# Patient Record
Sex: Female | Born: 1955
Health system: Southern US, Community
[De-identification: ages and names within clinical notes are randomized; demographics above are authoritative.]

## PROBLEM LIST (undated history)

## (undated) DIAGNOSIS — C679 Malignant neoplasm of bladder, unspecified: Secondary | ICD-10-CM

## (undated) DIAGNOSIS — M1990 Unspecified osteoarthritis, unspecified site: Secondary | ICD-10-CM

## (undated) DIAGNOSIS — E785 Hyperlipidemia, unspecified: Secondary | ICD-10-CM

## (undated) DIAGNOSIS — Z860100 Personal history of colon polyps, unspecified: Secondary | ICD-10-CM

## (undated) DIAGNOSIS — M797 Fibromyalgia: Secondary | ICD-10-CM

## (undated) DIAGNOSIS — K5790 Diverticulosis of intestine, part unspecified, without perforation or abscess without bleeding: Secondary | ICD-10-CM

## (undated) DIAGNOSIS — B029 Zoster without complications: Secondary | ICD-10-CM

## (undated) DIAGNOSIS — Z8619 Personal history of other infectious and parasitic diseases: Secondary | ICD-10-CM

## (undated) HISTORY — DX: Malignant neoplasm of bladder, unspecified: C67.9

## (undated) HISTORY — DX: Diverticulosis of intestine, part unspecified, without perforation or abscess without bleeding: K57.90

## (undated) HISTORY — DX: Personal history of colon polyps, unspecified: Z86.0100

## (undated) HISTORY — DX: Unspecified osteoarthritis, unspecified site: M19.90

## (undated) HISTORY — DX: Personal history of other infectious and parasitic diseases: Z86.19

## (undated) HISTORY — DX: Hyperlipidemia, unspecified: E78.5

## (undated) HISTORY — DX: Personal history of colonic polyps: Z86.010

## (undated) HISTORY — DX: Fibromyalgia: M79.7

## (undated) HISTORY — PX: TRANSURETHRAL RESECTION OF BLADDER TUMOR: SHX2575

---

## 2000-08-06 ENCOUNTER — Other Ambulatory Visit: Admission: RE | Admit: 2000-08-06 | Discharge: 2000-08-06 | Payer: Self-pay | Admitting: *Deleted

## 2006-04-07 DIAGNOSIS — Z8551 Personal history of malignant neoplasm of bladder: Secondary | ICD-10-CM | POA: Insufficient documentation

## 2006-04-07 DIAGNOSIS — C679 Malignant neoplasm of bladder, unspecified: Secondary | ICD-10-CM

## 2006-04-07 HISTORY — DX: Malignant neoplasm of bladder, unspecified: C67.9

## 2007-01-17 ENCOUNTER — Emergency Department: Payer: Self-pay | Admitting: Unknown Physician Specialty

## 2007-04-08 HISTORY — PX: OTHER SURGICAL HISTORY: SHX169

## 2009-06-15 DIAGNOSIS — Z8551 Personal history of malignant neoplasm of bladder: Secondary | ICD-10-CM | POA: Insufficient documentation

## 2009-06-15 DIAGNOSIS — M25559 Pain in unspecified hip: Secondary | ICD-10-CM | POA: Insufficient documentation

## 2009-06-21 ENCOUNTER — Ambulatory Visit: Payer: Self-pay | Admitting: Family Medicine

## 2009-06-28 DIAGNOSIS — E785 Hyperlipidemia, unspecified: Secondary | ICD-10-CM

## 2009-06-28 DIAGNOSIS — E782 Mixed hyperlipidemia: Secondary | ICD-10-CM | POA: Insufficient documentation

## 2009-06-28 DIAGNOSIS — E559 Vitamin D deficiency, unspecified: Secondary | ICD-10-CM | POA: Insufficient documentation

## 2009-06-28 HISTORY — DX: Hyperlipidemia, unspecified: E78.5

## 2009-07-19 ENCOUNTER — Ambulatory Visit: Payer: Self-pay | Admitting: Family Medicine

## 2009-07-19 DIAGNOSIS — M76899 Other specified enthesopathies of unspecified lower limb, excluding foot: Secondary | ICD-10-CM | POA: Insufficient documentation

## 2009-08-15 ENCOUNTER — Ambulatory Visit: Payer: Self-pay | Admitting: Unknown Physician Specialty

## 2010-04-07 ENCOUNTER — Emergency Department: Payer: Self-pay | Admitting: Internal Medicine

## 2010-07-15 ENCOUNTER — Emergency Department: Payer: Self-pay | Admitting: Emergency Medicine

## 2011-04-08 HISTORY — PX: COLONOSCOPY: SHX174

## 2011-04-08 LAB — HM PAP SMEAR

## 2011-04-08 LAB — HM MAMMOGRAPHY

## 2011-04-08 LAB — HM COLONOSCOPY

## 2012-03-08 ENCOUNTER — Ambulatory Visit: Payer: Self-pay | Admitting: Family Medicine

## 2012-04-07 DIAGNOSIS — M797 Fibromyalgia: Secondary | ICD-10-CM

## 2012-04-07 HISTORY — DX: Fibromyalgia: M79.7

## 2012-06-17 ENCOUNTER — Other Ambulatory Visit: Payer: Self-pay | Admitting: Rheumatology

## 2012-06-17 DIAGNOSIS — Z780 Asymptomatic menopausal state: Secondary | ICD-10-CM

## 2012-06-28 ENCOUNTER — Ambulatory Visit
Admission: RE | Admit: 2012-06-28 | Discharge: 2012-06-28 | Disposition: A | Discharge status: Home / Self Care | Payer: BC Managed Care – PPO | Source: Ambulatory Visit | Attending: Rheumatology | Admitting: Rheumatology

## 2012-06-28 DIAGNOSIS — Z780 Asymptomatic menopausal state: Secondary | ICD-10-CM

## 2012-08-10 ENCOUNTER — Ambulatory Visit: Payer: Self-pay | Admitting: Family Medicine

## 2013-04-14 ENCOUNTER — Ambulatory Visit: Payer: Self-pay | Admitting: Neurology

## 2013-11-23 ENCOUNTER — Ambulatory Visit: Payer: BC Managed Care – PPO | Admitting: Neurology

## 2013-11-24 ENCOUNTER — Ambulatory Visit: Payer: Self-pay | Admitting: Family Medicine

## 2013-11-30 ENCOUNTER — Ambulatory Visit: Payer: Self-pay | Admitting: Family Medicine

## 2013-12-02 ENCOUNTER — Inpatient Hospital Stay: Payer: Self-pay | Admitting: Internal Medicine

## 2013-12-02 LAB — URINALYSIS, COMPLETE
Bacteria: NONE SEEN
Bilirubin,UR: NEGATIVE
Glucose,UR: NEGATIVE mg/dL (ref 0–75)
Leukocyte Esterase: NEGATIVE
Nitrite: NEGATIVE
Ph: 5 (ref 4.5–8.0)
Protein: 30
RBC,UR: 2 /HPF (ref 0–5)
Specific Gravity: 1.03 (ref 1.003–1.030)
Squamous Epithelial: 4
WBC UR: 2 /HPF (ref 0–5)

## 2013-12-02 LAB — COMPREHENSIVE METABOLIC PANEL
Albumin: 3.7 g/dL (ref 3.4–5.0)
Alkaline Phosphatase: 101 U/L
Anion Gap: 13 (ref 7–16)
BUN: 20 mg/dL — ABNORMAL HIGH (ref 7–18)
Bilirubin,Total: 1.1 mg/dL — ABNORMAL HIGH (ref 0.2–1.0)
Calcium, Total: 9 mg/dL (ref 8.5–10.1)
Chloride: 104 mmol/L (ref 98–107)
Co2: 20 mmol/L — ABNORMAL LOW (ref 21–32)
Creatinine: 0.88 mg/dL (ref 0.60–1.30)
EGFR (African American): >60
EGFR (Non-African Amer.): >60
Glucose: 155 mg/dL — ABNORMAL HIGH (ref 65–99)
Osmolality: 280 (ref 275–301)
Potassium: 3.9 mmol/L (ref 3.5–5.1)
SGOT(AST): 18 U/L (ref 15–37)
SGPT (ALT): 30 U/L
Sodium: 137 mmol/L (ref 136–145)
Total Protein: 7.4 g/dL (ref 6.4–8.2)

## 2013-12-02 LAB — CBC WITH DIFFERENTIAL/PLATELET
Basophil #: 0.2 10*3/uL — ABNORMAL HIGH (ref 0.0–0.1)
Basophil %: 1 %
Eosinophil #: 0.7 10*3/uL (ref 0.0–0.7)
Eosinophil %: 4.3 %
HCT: 54.7 % — ABNORMAL HIGH (ref 35.0–47.0)
HGB: 18.2 g/dL — ABNORMAL HIGH (ref 12.0–16.0)
Lymphocyte #: 3.3 10*3/uL (ref 1.0–3.6)
Lymphocyte %: 20.4 %
MCH: 28.9 pg (ref 26.0–34.0)
MCHC: 33.3 g/dL (ref 32.0–36.0)
MCV: 87 fL (ref 80–100)
Monocyte #: 1.2 x10 3/mm — ABNORMAL HIGH (ref 0.2–0.9)
Monocyte %: 7.4 %
Neutrophil #: 10.8 10*3/uL — ABNORMAL HIGH (ref 1.4–6.5)
Neutrophil %: 66.9 %
Platelet: 371 10*3/uL (ref 150–440)
RBC: 6.3 10*6/uL — ABNORMAL HIGH (ref 3.80–5.20)
RDW: 14 % (ref 11.5–14.5)
WBC: 16.1 10*3/uL — ABNORMAL HIGH (ref 3.6–11.0)

## 2013-12-02 LAB — LIPASE, BLOOD: Lipase: 194 U/L (ref 73–393)

## 2013-12-03 LAB — BASIC METABOLIC PANEL
Anion Gap: 1 — ABNORMAL LOW (ref 7–16)
BUN: 13 mg/dL (ref 7–18)
Calcium, Total: 7.4 mg/dL — ABNORMAL LOW (ref 8.5–10.1)
Chloride: 111 mmol/L — ABNORMAL HIGH (ref 98–107)
Co2: 27 mmol/L (ref 21–32)
Creatinine: 0.75 mg/dL (ref 0.60–1.30)
EGFR (African American): >60
EGFR (Non-African Amer.): >60
Glucose: 81 mg/dL (ref 65–99)
Osmolality: 277 (ref 275–301)
Potassium: 3.4 mmol/L — ABNORMAL LOW (ref 3.5–5.1)
Sodium: 139 mmol/L (ref 136–145)

## 2013-12-03 LAB — CBC WITH DIFFERENTIAL/PLATELET
Basophil #: 0.1 10*3/uL (ref 0.0–0.1)
Basophil %: 0.7 %
Eosinophil #: 0.5 10*3/uL (ref 0.0–0.7)
Eosinophil %: 4.7 %
HCT: 43.3 % (ref 35.0–47.0)
HGB: 14.3 g/dL (ref 12.0–16.0)
Lymphocyte #: 3.2 10*3/uL (ref 1.0–3.6)
Lymphocyte %: 31.4 %
MCH: 29.4 pg (ref 26.0–34.0)
MCHC: 33 g/dL (ref 32.0–36.0)
MCV: 89 fL (ref 80–100)
Monocyte #: 0.8 x10 3/mm (ref 0.2–0.9)
Monocyte %: 8 %
Neutrophil #: 5.6 10*3/uL (ref 1.4–6.5)
Neutrophil %: 55.2 %
Platelet: 216 10*3/uL (ref 150–440)
RBC: 4.86 10*6/uL (ref 3.80–5.20)
RDW: 13.3 % (ref 11.5–14.5)
WBC: 10.2 10*3/uL (ref 3.6–11.0)

## 2013-12-07 LAB — CULTURE, BLOOD (SINGLE)

## 2014-04-07 HISTORY — PX: COLONOSCOPY: SHX174

## 2014-07-19 ENCOUNTER — Emergency Department
Admit: 2014-07-19 | Disposition: A | Discharge status: Home / Self Care | Payer: Self-pay | Admitting: Emergency Medicine

## 2014-07-19 DIAGNOSIS — K5790 Diverticulosis of intestine, part unspecified, without perforation or abscess without bleeding: Secondary | ICD-10-CM

## 2014-07-19 DIAGNOSIS — Z8719 Personal history of other diseases of the digestive system: Secondary | ICD-10-CM

## 2014-07-19 HISTORY — DX: Personal history of other diseases of the digestive system: Z87.19

## 2014-07-19 HISTORY — DX: Diverticulosis of intestine, part unspecified, without perforation or abscess without bleeding: K57.90

## 2014-07-19 LAB — URINALYSIS, COMPLETE
Bacteria: NONE SEEN
Bilirubin,UR: NEGATIVE
Glucose,UR: NEGATIVE mg/dL (ref 0–75)
Ketone: NEGATIVE
Leukocyte Esterase: NEGATIVE
Nitrite: NEGATIVE
Ph: 5 (ref 4.5–8.0)
Protein: 30
Specific Gravity: 1.032 (ref 1.003–1.030)

## 2014-07-19 LAB — TROPONIN I: Troponin-I: <0.03 ng/mL

## 2014-07-19 LAB — COMPREHENSIVE METABOLIC PANEL
Albumin: 4.6 g/dL
Alkaline Phosphatase: 113 U/L
Anion Gap: 8 (ref 7–16)
BUN: 15 mg/dL
Bilirubin,Total: 0.8 mg/dL
Calcium, Total: 9 mg/dL
Chloride: 105 mmol/L
Co2: 25 mmol/L
Creatinine: 0.61 mg/dL
EGFR (African American): >60
EGFR (Non-African Amer.): >60
Glucose: 130 mg/dL — ABNORMAL HIGH
Potassium: 3.6 mmol/L
SGOT(AST): 25 U/L
SGPT (ALT): 24 U/L
Sodium: 138 mmol/L
Total Protein: 8 g/dL

## 2014-07-19 LAB — CBC WITH DIFFERENTIAL/PLATELET
Basophil #: 0.1 10*3/uL (ref 0.0–0.1)
Basophil %: 0.9 %
Eosinophil #: 0.2 10*3/uL (ref 0.0–0.7)
Eosinophil %: 2.3 %
HCT: 40.5 % (ref 35.0–47.0)
HGB: 13.8 g/dL (ref 12.0–16.0)
Lymphocyte #: 2.3 10*3/uL (ref 1.0–3.6)
Lymphocyte %: 21 %
MCH: 29.2 pg (ref 26.0–34.0)
MCHC: 34.1 g/dL (ref 32.0–36.0)
MCV: 86 fL (ref 80–100)
Monocyte #: 0.6 x10 3/mm (ref 0.2–0.9)
Monocyte %: 5.8 %
Neutrophil #: 7.6 10*3/uL — ABNORMAL HIGH (ref 1.4–6.5)
Neutrophil %: 70 %
Platelet: 228 10*3/uL (ref 150–440)
RBC: 4.73 10*6/uL (ref 3.80–5.20)
RDW: 13.8 % (ref 11.5–14.5)
WBC: 10.8 10*3/uL (ref 3.6–11.0)

## 2014-07-19 LAB — LIPASE, BLOOD: Lipase: 55 U/L — ABNORMAL HIGH

## 2014-07-24 LAB — BASIC METABOLIC PANEL
BUN: 11 mg/dL (ref 4–21)
Creatinine: 0.6 mg/dL (ref <=1.1)
Glucose: 190 mg/dL
Potassium: 4.2 mmol/L (ref 3.4–5.3)
Sodium: 143 mmol/L (ref 137–147)

## 2014-07-24 LAB — HEPATIC FUNCTION PANEL
ALT: 31 U/L (ref 7–35)
AST: 27 U/L (ref 13–35)

## 2014-07-24 LAB — CBC AND DIFFERENTIAL
HCT: 44 % (ref 36–46)
Hemoglobin: 14.5 g/dL (ref 12.0–16.0)
Platelets: 277 10*3/uL (ref 150–399)
WBC: 7.5 10^3/mL

## 2014-07-24 LAB — TSH: TSH: 1.4 u[IU]/mL (ref <=5.90)

## 2014-07-29 NOTE — Discharge Summary (Signed)
PATIENT NAME:  Rebekah Myers, Rebekah Myers MR#:  [id number] DATE OF BIRTH:  05/10/1955  DATE OF ADMISSION:  12/02/2013 DATE OF DISCHARGE:  12/04/2013  DISCHARGE DIAGNOSES: 1.  Right lower lobe pneumonia.  2.  Acute bronchitis.  3.  Nausea, vomiting, likely from gastritis.  4.  Dehydration.   DISCHARGE MEDICATIONS: Are:  1.  Gabapentin 100 mg 2 capsules oral once a day.  2.  Advair Diskus  one puff inhaled 2 times a day.  3.  Promethazine 25 mg every 8 hours as needed for nausea or vomiting.  4.  Gabapentin 100 mg oral once a day.  5.  Dexilant 60 mg oral 2 times a day.  6.  Erythromycin 500 mg once a day.  7.  Cefpodoxime 200 mg oral 2 times a day.  8.  Ventolin HFA 2 puffs inhaled every 4 hours as needed.  9.  Tussionex 5 mL oral 2 times a day as needed for cough.   DISCHARGE INSTRUCTIONS:  Regular diet, activity as tolerated, follow-up with primary care physician in 1 week.   IMAGING STUDIES DONE:  Include:  1.  A chest x-ray, which is normal.  2.  CT scan of the abdomen and pelvis which showed a fatty liver, nothing acute, but did show right lower lobe infiltrate, without any effusion.   ADMITTING HISTORY AND PHYSICAL:  Please see detailed H and P dictated previously. In brief, a 59 year old female patient with past history of smoking brought into the hospital complaining of nausea, vomiting, chronic cough; the patient has gone through 2 rounds of prednisone and antibiotics as outpatient and CT scan showing right lower lobe infiltrate, intractable nausea, or vomiting, the patient was admitted to hospitalist service.   HOSPITAL COURSE: 1.  Right lower lobe pneumonia with acute bronchitis. The patient was started on IV antibiotics with ceftriaxone, azithromycin along with IV steroids, scheduled nebulizers, oxygen support with which she improved well, and during the 48 hours of stay patient feels significantly better. Still has some cough but her wheezing has resolved. No shortness of breath. Feels  stable to go home and will be discharged back home on cefpodoxime, and azithromycin along with prednisone taper which she already has at home. The patient has been given an inhaler with albuterol to use 2 puffs every 4 hours as needed.  2.  Nausea or vomiting. This is likely secondary from gastritis due to prolonged use of prednisone. This has resolved with treatment with IV Protonix. She will be switched to Dexilant  2 times a day as long as she is on steroids. She has tolerated food. No vomiting.   Prior to discharge the patient's lungs sound clear, good air entry; heart sounds are S1, S2; abdomen is soft; no edema.   DISCHARGE TIME SPENT ON DAY OF DISCHARGE:  Was 40 minutes.    ____________________________ Leia Alf Graceland Wachter, MD srs:nt D: 12/05/2013 12:52:00 ET T: 12/05/2013 16:12:55 ET JOB#: [id number]  cc: Rebekah Heimlich R. Dewanna Hurston, MD, <Dictator> Rebekah Carp MD ELECTRONICALLY SIGNED 12/20/2013 20:48

## 2014-07-29 NOTE — H&P (Signed)
PATIENT NAME:  Rebekah Myers, Rebekah Myers MR#:  [id number] DATE OF BIRTH:  March 05, 1956  DATE OF ADMISSION:  12/02/2013  PRIMARY CARE PHYSICIAN:  Jerrol Banana, MD, at [facility].   CHIEF COMPLAINT:  Dry hacky cough for about 3 weeks and significant nausea, vomiting, and epigastric pain for the last couple days.   HISTORY OF PRESENT ILLNESS:  Rebekah Myers is a 59 year old very pleasant, Caucasian female with history of tobacco abuse in the remote past, has had 2 rounds of antibiotics with erythromycin and Levaquin for her nonproductive cough. She also received increasing dose of prednisone which has made her stomach upset and she has been vomiting for the last several days to the point she cannot keep anything in the stomach, and has had dry heaves. Comes to the Emergency Room feeling very weak, is clinically dehydrated, and had vomited about 45 minutes ago prior to my arrival.   Her CT scan of the abdomen, pelvis, and a CT scan of the chest shows lingering right lower lobe pneumonia. She also feels very weak and exhausted. Patient is going to be admitted for right lower lobe pneumonia and nausea, vomiting, diarrhea with dehydration.   PAST MEDICAL HISTORY:  1.  Bladder cancer with ongoing treatment, she has her oncologist at [facility]  2.  Remote history of smoking with possible underlying COPD although patient does not have that diagnosis.  3.  Recent treatment for nonproductive cough/pneumonia.   ALLERGIES:  TO:  1.  PENICILLIN CAUSES SWELLING, PATIENT HAS HAD IT MANY YEARS AGO.  2.  SULFA.   HOME MEDICATIONS:  Are:  1.  Promethazines 25 mg 1 suppository every 8 hours as needed.  2.  Gabapentin 100 mg 1 capsule in the morning, 100 mg 2 capsules at bedtime.  3.  Dexilant 60 mg p.o. daily.  4.  Advair 500/50 one puff b.i.d.    SOCIAL HISTORY:  She is an ex-smoker, nonalcoholic, denies any drug use.   REVIEW OF SYSTEMS:  CONSTITUTIONAL:  Positive for fatigue, weakness, and chills.   EYES:  No blurred or double vision, or  redness.  ENT:  No tinnitus, ear pain, hearing loss, or postnasal drip.  RESPIRATORY:  Positive for cough and dyspnea, no hemoptysis.  CARDIOVASCULAR:  No chest pain, orthopnea, edema, palpitations.  GASTROINTESTINAL:  Positive for nausea, vomiting, epigastric abdominal pain and GERD, no rectal bleed, or hemorrhoids.  GENITOURINARY:  No dysuria, hematuria, or frequency.  ENDOCRINE:  No polyuria, nocturia, or thyroid problems.  HEMATOLOGY:  No anemia or easy bruising.  SKIN:  No acne, rash, or lesion.  MUSCULOSKELETAL:  Positive for arthritis. No cramps or swelling, or gout.  NEUROLOGIC:  No CVA, TIA, dysarthria.  PSYCHIATRIC:  No anxiety, depression, or bipolar disorder, all other systems reviewed are negative.   PHYSICAL EXAMINATION:  GENERAL:  Patient is awake, alert, oriented x 3, not in acute distress.  VITAL SIGNS:  Afebrile, pulse is 96, blood pressure is 120/79, sats are 94% on room air.  HEENT:  Atraumatic, normocephalic, pupils PERLA, EOMI intact, oral mucosa is dry.  NECK:  Supple. No JVD. No carotid bruit.  RESPIRATORY:  No distress, labored breathing, there are some crackles present right bases, no rhonchi, occasional wheezing heard, no use of accessory muscles.  CARDIOVASCULAR:  Both the heart sounds are normal, tachycardia present, no murmur heard, PMI not lateralized, chest nontender.  EXTREMITIES:  Good pedal pulses. Good femoral pulses. No lower extremity edema.  ABDOMEN:  Soft, benign, nontender. No  organomegaly. Positive bowel sounds.  NEUROLOGIC:  Grossly intact cranial nerves II through XII, no motor, or sensory deficits.  PSYCHIATRIC:  The patient is awake, alert, oriented x 3.   DIAGNOSTIC DATA:   1.  CT of the abdomen, pelvis with contrast shows right lower lobe infiltrate consistent with pneumonia, diffuse fatty infiltration of the liver, small hilar hernia.  2.  UA negative for urinary tract infection, white count is 16.1,  H and H is 18.2 and 54.7; platelet count is 371,000, creatinine is 0.88, sodium is 137, potassium 3.9, LFTs within normal limits, lipase is 194.   ASSESSMENT:  A 59 year old Rebekah Myers with history of bladder cancer and remote history of tobacco abuse comes in with:  1.  Intractable nausea, vomiting with epigastric pain after taking 2 rounds of high-dose steroids for her cough and respiratory infection. Patient appears to be dehydrated with hemoglobin and hematocrit that is hemoconcentrated. We will admit her to medical floor, give her intravenous fluids aggressively; monitor Ins and Outs, and CBC, and metB.  The patient will be started on some ice chips and clear liquids and we will give her intravenous Zofran as needed for nausea and vomiting.   2.  Persistent right liver lobe pneumonia. The patient had 2 rounds of antibiotics with Levaquin and erythromycin. She continues to have some cough; elevated white count of 16.8. We will start her  on intravenous Rocephin and Zithromax, the patient has a history of ALLERGIES TO PENICILLIN, did discuss with her about cross-reactivity with ceftriaxone group of drugs. Patient has voiced understanding and is okay with it. No indication for IV steroids at this time. Use her inhalers and DuoNebs as needed for shortness of breath.  3.  History of bladder cancer. Patient is undergoing treatment at [facility] with her oncologist.  4.  Deep vein thrombosis prophylaxis. We will give her subcutaneous heparin t.i.d.  5.  Above was discussed with patient and her grandson who was present in the Emergency Room.   TIME SPENT:  Was 50 minutes.    ____________________________ Hart Rochester Posey Pronto, MD sap:nt D: 12/02/2013 20:21:02 ET T: 12/02/2013 22:33:42 ET JOB#: [id number]  cc: Jebediah Macrae A. Posey Pronto, MD, <Dictator> Richard L. Rosanna Randy, MD Ilda Basset MD ELECTRONICALLY SIGNED 12/13/2013 14:52

## 2014-09-27 ENCOUNTER — Encounter: Payer: Self-pay | Admitting: Obstetrics and Gynecology

## 2014-10-23 DIAGNOSIS — N950 Postmenopausal bleeding: Secondary | ICD-10-CM | POA: Insufficient documentation

## 2014-10-23 DIAGNOSIS — K5790 Diverticulosis of intestine, part unspecified, without perforation or abscess without bleeding: Secondary | ICD-10-CM | POA: Insufficient documentation

## 2014-10-23 DIAGNOSIS — R748 Abnormal levels of other serum enzymes: Secondary | ICD-10-CM | POA: Insufficient documentation

## 2014-10-23 DIAGNOSIS — C679 Malignant neoplasm of bladder, unspecified: Secondary | ICD-10-CM | POA: Insufficient documentation

## 2014-10-23 DIAGNOSIS — Z1211 Encounter for screening for malignant neoplasm of colon: Secondary | ICD-10-CM | POA: Insufficient documentation

## 2014-10-23 DIAGNOSIS — K2900 Acute gastritis without bleeding: Secondary | ICD-10-CM | POA: Insufficient documentation

## 2014-10-23 DIAGNOSIS — J208 Acute bronchitis due to other specified organisms: Secondary | ICD-10-CM | POA: Insufficient documentation

## 2014-10-23 DIAGNOSIS — K219 Gastro-esophageal reflux disease without esophagitis: Secondary | ICD-10-CM | POA: Insufficient documentation

## 2014-10-23 DIAGNOSIS — R062 Wheezing: Secondary | ICD-10-CM | POA: Insufficient documentation

## 2014-10-23 DIAGNOSIS — J189 Pneumonia, unspecified organism: Secondary | ICD-10-CM | POA: Insufficient documentation

## 2014-10-23 DIAGNOSIS — R0789 Other chest pain: Secondary | ICD-10-CM | POA: Insufficient documentation

## 2014-10-23 DIAGNOSIS — R5383 Other fatigue: Secondary | ICD-10-CM | POA: Insufficient documentation

## 2014-10-23 DIAGNOSIS — J40 Bronchitis, not specified as acute or chronic: Secondary | ICD-10-CM | POA: Insufficient documentation

## 2014-10-23 DIAGNOSIS — M791 Myalgia, unspecified site: Secondary | ICD-10-CM | POA: Insufficient documentation

## 2014-11-28 NOTE — H&P (Signed)
Ms. Cislo is a 59 y.o. female with menorrhagia+ .PMB  EMBX benign in June 2016  cx biopsy - benign . Saline test shows  Endometrial polyp running the length of the endometrium 2.1 x0.4 cm    Past Medical History:  has a past medical history of Arthritis; Bladder cancer; Diverticulosis; GERD (gastroesophageal reflux disease); Hyperlipidemia; and Vitamin D deficiency.  Past Surgical History:  has past surgical history that includes Bladder cancer surgery; ORIF tarsal fracture; and Colonoscopy (08/15/2009). Family History: family history includes Breast cancer in her mother; Diabetes mellitus in her father; Lung cancer in her mother; Melanoma in her father; Stroke in her father and mother. Social History:  reports that she has quit smoking. She does not have any smokeless tobacco history on file. She reports that she does not drink alcohol. OB/GYN History:  OB History    Gravida Para Term Preterm AB TAB SAB Ectopic Multiple Living   1 1 1       1       Allergies: is allergic to fentanyl; penicillin v potassium; and sulfa (sulfonamide antibiotics). Medications:  Current outpatient prescriptions:  . omeprazole (PRILOSEC) 40 MG DR capsule, Take 1 capsule (40 mg total) by mouth once daily. (Patient taking differently: Take 40 mg by mouth once daily as needed. ), Disp: 30 capsule, Rfl: 2 . polyethylene glycol (MIRALAX) powder, Take as directed for colonoscopy prep., Disp: 255 g, Rfl: 0  Review of Systems: General: No fatigue or weight loss Eyes:No vision changes Ears:No hearing difficulty Respiratory: No cough or shortness of breath Pulmonary: No asthma or shortness of breath Cardiovascular: No chest pain, palpitations, dyspnea on exertion Gastrointestinal: No abdominal bloating, chronic diarrhea, constipations, masses, pain or  hematochezia Genitourinary:No hematuria, dysuria, abnormal vaginal discharge, pelvic pain, Menometrorrhagia Lymphatic:No swollen lymph nodes Musculoskeletal:No muscle weakness Neurologic:No extremity weakness, syncope, seizure disorder Psychiatric:No history of depression, delusions or suicidal/homicidal ideation   Exam:   Filed Vitals:   11/22/14 1053  BP: 117/83  Pulse: 77    Body mass index is 31 kg/(m^2).   WDWN white/ female in NAD  Lungs: CTA  CV : RRR without murmur  Abdomen: soft , no mass, normal active bowel sounds, non-tender, no rebound tenderness Pelvic: tanner stage 5 ,  External genitalia: vulva /labia no lesions Urethra: no prolapse Vagina: normal physiologic d/c Cervix: 1.5 cm fleshy polyp protruding from os. Polyp bx performed Uterus: normal size shape and contour, non-tender Saline infusion sono :endo stripe= 14 mm . Within the cavity on oblong lesion running the length of the cavity . Measures =2.1x 0.4 cm  Impression:   The primary encounter diagnosis was Cervical polyp. Diagnoses of Post-menopausal bleeding and Endometrial polyp  Plan:   endometrial resection of the endometrial polyp with the Myosure  Endometrial curettage      Orders Placed This Encounter  Procedures  . Pathology Report - [facility]    Cervical polyp    Return for preop.  Sanyah Molnar JANSE Tarique Loveall

## 2014-11-29 ENCOUNTER — Encounter
Admission: RE | Admit: 2014-11-29 | Discharge: 2014-11-29 | Disposition: A | Discharge status: Home / Self Care | Payer: BLUE CROSS/BLUE SHIELD | Source: Ambulatory Visit | Attending: Obstetrics and Gynecology | Admitting: Obstetrics and Gynecology

## 2014-11-29 DIAGNOSIS — Z01812 Encounter for preprocedural laboratory examination: Secondary | ICD-10-CM | POA: Insufficient documentation

## 2014-11-29 LAB — CBC
HCT: 41.1 % (ref 35.0–47.0)
Hemoglobin: 14.1 g/dL (ref 12.0–16.0)
MCH: 29.3 pg (ref 26.0–34.0)
MCHC: 34.3 g/dL (ref 32.0–36.0)
MCV: 85.4 fL (ref 80.0–100.0)
Platelets: 206 10*3/uL (ref 150–440)
RBC: 4.81 MIL/uL (ref 3.80–5.20)
RDW: 13.7 % (ref 11.5–14.5)
WBC: 7.7 10*3/uL (ref 3.6–11.0)

## 2014-11-29 LAB — BASIC METABOLIC PANEL
Anion gap: 9 (ref 5–15)
BUN: 12 mg/dL (ref 6–20)
CO2: 26 mmol/L (ref 22–32)
Calcium: 9.3 mg/dL (ref 8.9–10.3)
Chloride: 106 mmol/L (ref 101–111)
Creatinine, Ser: 0.51 mg/dL (ref 0.44–1.00)
GFR calc Af Amer: >60 mL/min (ref >60)
GFR calc non Af Amer: >60 mL/min (ref >60)
Glucose, Bld: 82 mg/dL (ref 65–99)
Potassium: 3.8 mmol/L (ref 3.5–5.1)
Sodium: 141 mmol/L (ref 135–145)

## 2014-11-29 LAB — ABO/RH: ABO/RH(D): A POS

## 2014-11-29 LAB — TYPE AND SCREEN
ABO/RH(D): A POS
Antibody Screen: NEGATIVE

## 2014-11-29 NOTE — Patient Instructions (Signed)
  Your procedure is scheduled on: Monday December 04, 2014. Report to Same Day Surgery. To find out your arrival time please [place] [phone] between 1PM - 3PM on Friday December 01, 2014.  Remember: Instructions that are not followed completely may result in serious medical risk, up to and including death, or upon the discretion of your surgeon and anesthesiologist your surgery may need to be rescheduled.    _x___ 1. Do not eat food or drink liquids after midnight. No gum chewing or hard candies.     ____ 2. No Alcohol for 24 hours before or after surgery.   ____ 3. Bring all medications with you on the day of surgery if instructed.    __x__ 4. Notify your doctor if there is any change in your medical condition     (cold, fever, infections).     Do not wear jewelry, make-up, hairpins, clips or nail polish.  Do not wear lotions, powders, or perfumes. You may wear deodorant.  Do not shave 48 hours prior to surgery. Men may shave face and neck.  Do not bring valuables to the hospital.    [facility] is not responsible for any belongings or valuables.               Contacts, dentures or bridgework may not be worn into surgery.  Leave your suitcase in the car. After surgery it may be brought to your room.  For patients admitted to the hospital, discharge time is determined by your treatment team.   Patients discharged the day of surgery will not be allowed to drive home.    Please read over the following fact sheets that you were given:   [facility] Preparing for Surgery  _x___ Take these medicines the morning of surgery with A SIP OF WATER:    1. omeprazole (PRILOSEC)    ____ Fleet Enema (as directed)   ____ Use CHG Soap as directed  ____ Use inhalers on the day of surgery  ____ Stop metformin 2 days prior to surgery    ____ Take 1/2 of usual insulin dose the night before surgery and none on the morning of surgery.   ____ Stop Coumadin/Plavix/aspirin on does not  apply.  _x___ Stop Anti-inflammatories Excedrine today. Tylenol is OK to take for pain.   ____ Stop supplements until after surgery.    ____ Bring C-Pap to the hospital.

## 2014-11-30 MED ORDER — GENTAMICIN SULFATE 40 MG/ML IJ SOLN
INTRAVENOUS | Status: AC
  Filled 2014-11-30: qty 9

## 2014-11-30 NOTE — Pre-Procedure Instructions (Addendum)
Spoke with Becky Lynn(11/30/14 at [phone]) at Dr Schermerhorn's regarding missing H+P.  She will let him know.

## 2014-11-30 NOTE — Pre-Procedure Instructions (Signed)
Request for spanish interpreter entered into computer for [place] on 12/05/14.

## 2014-11-30 NOTE — Pre-Procedure Instructions (Signed)
H+P is in epic.

## 2014-12-04 ENCOUNTER — Ambulatory Visit: Payer: BLUE CROSS/BLUE SHIELD | Admitting: Anesthesiology

## 2014-12-04 ENCOUNTER — Encounter
Admission: RE | Disposition: A | Discharge status: Home / Self Care | Payer: Self-pay | Source: Ambulatory Visit | Attending: Obstetrics and Gynecology

## 2014-12-04 ENCOUNTER — Encounter: Payer: Self-pay | Admitting: *Deleted

## 2014-12-04 ENCOUNTER — Ambulatory Visit
Admission: RE | Admit: 2014-12-04 | Discharge: 2014-12-04 | Disposition: A | Discharge status: Home / Self Care | Payer: BLUE CROSS/BLUE SHIELD | Source: Ambulatory Visit | Attending: Obstetrics and Gynecology | Admitting: Obstetrics and Gynecology

## 2014-12-04 DIAGNOSIS — Z803 Family history of malignant neoplasm of breast: Secondary | ICD-10-CM | POA: Insufficient documentation

## 2014-12-04 DIAGNOSIS — M1990 Unspecified osteoarthritis, unspecified site: Secondary | ICD-10-CM | POA: Insufficient documentation

## 2014-12-04 DIAGNOSIS — Z808 Family history of malignant neoplasm of other organs or systems: Secondary | ICD-10-CM | POA: Insufficient documentation

## 2014-12-04 DIAGNOSIS — E785 Hyperlipidemia, unspecified: Secondary | ICD-10-CM | POA: Diagnosis not present

## 2014-12-04 DIAGNOSIS — K219 Gastro-esophageal reflux disease without esophagitis: Secondary | ICD-10-CM | POA: Insufficient documentation

## 2014-12-04 DIAGNOSIS — Z87891 Personal history of nicotine dependence: Secondary | ICD-10-CM | POA: Insufficient documentation

## 2014-12-04 DIAGNOSIS — Z8551 Personal history of malignant neoplasm of bladder: Secondary | ICD-10-CM | POA: Diagnosis not present

## 2014-12-04 DIAGNOSIS — Z880 Allergy status to penicillin: Secondary | ICD-10-CM | POA: Diagnosis not present

## 2014-12-04 DIAGNOSIS — Z833 Family history of diabetes mellitus: Secondary | ICD-10-CM | POA: Diagnosis not present

## 2014-12-04 DIAGNOSIS — N840 Polyp of corpus uteri: Secondary | ICD-10-CM | POA: Insufficient documentation

## 2014-12-04 DIAGNOSIS — Z885 Allergy status to narcotic agent status: Secondary | ICD-10-CM | POA: Diagnosis not present

## 2014-12-04 DIAGNOSIS — E559 Vitamin D deficiency, unspecified: Secondary | ICD-10-CM | POA: Insufficient documentation

## 2014-12-04 DIAGNOSIS — N950 Postmenopausal bleeding: Secondary | ICD-10-CM | POA: Diagnosis not present

## 2014-12-04 DIAGNOSIS — N711 Chronic inflammatory disease of uterus: Secondary | ICD-10-CM | POA: Diagnosis not present

## 2014-12-04 DIAGNOSIS — Z882 Allergy status to sulfonamides status: Secondary | ICD-10-CM | POA: Diagnosis not present

## 2014-12-04 DIAGNOSIS — N841 Polyp of cervix uteri: Secondary | ICD-10-CM | POA: Insufficient documentation

## 2014-12-04 DIAGNOSIS — Z823 Family history of stroke: Secondary | ICD-10-CM | POA: Insufficient documentation

## 2014-12-04 DIAGNOSIS — Z9889 Other specified postprocedural states: Secondary | ICD-10-CM | POA: Diagnosis not present

## 2014-12-04 HISTORY — PX: DILATATION & CURETTAGE/HYSTEROSCOPY WITH MYOSURE: SHX6511

## 2014-12-04 SURGERY — DILATATION & CURETTAGE/HYSTEROSCOPY WITH MYOSURE
Anesthesia: General | Wound class: Clean Contaminated

## 2014-12-04 MED ORDER — ACETAMINOPHEN 10 MG/ML IV SOLN
INTRAVENOUS | Status: DC | PRN
  Administered 2014-12-04: 1000 mg via INTRAVENOUS

## 2014-12-04 MED ORDER — LACTATED RINGERS IV SOLN
INTRAVENOUS | Status: DC
  Administered 2014-12-04: 07:00:00 via INTRAVENOUS

## 2014-12-04 MED ORDER — SILVER NITRATE-POT NITRATE 75-25 % EX MISC
CUTANEOUS | Status: AC
  Filled 2014-12-04: qty 2

## 2014-12-04 MED ORDER — FENTANYL CITRATE (PF) 100 MCG/2ML IJ SOLN: 25.0000 ug | INTRAMUSCULAR | Status: DC | PRN

## 2014-12-04 MED ORDER — HYDROMORPHONE HCL 1 MG/ML IJ SOLN
INTRAMUSCULAR | Status: AC
  Administered 2014-12-04: 0.5 mg via INTRAVENOUS
  Filled 2014-12-04: qty 1

## 2014-12-04 MED ORDER — NAPROXEN 500 MG PO TABS
500.0000 mg | ORAL_TABLET | Freq: Two times a day (BID) | ORAL | Status: DC | PRN
  Filled 2014-12-04: qty 1

## 2014-12-04 MED ORDER — SILVER NITRATE-POT NITRATE 75-25 % EX MISC
CUTANEOUS | Status: AC
  Filled 2014-12-04: qty 4

## 2014-12-04 MED ORDER — DEXAMETHASONE SODIUM PHOSPHATE 4 MG/ML IJ SOLN
INTRAMUSCULAR | Status: DC | PRN
  Administered 2014-12-04: 5 mg via INTRAVENOUS

## 2014-12-04 MED ORDER — PHENYLEPHRINE HCL 10 MG/ML IJ SOLN
INTRAMUSCULAR | Status: DC | PRN
  Administered 2014-12-04 (×4): 100 ug via INTRAVENOUS

## 2014-12-04 MED ORDER — ONDANSETRON HCL 4 MG/2ML IJ SOLN
4.0000 mg | Freq: Once | INTRAMUSCULAR | Status: AC | PRN
  Administered 2014-12-04: 4 mg via INTRAVENOUS

## 2014-12-04 MED ORDER — GLYCOPYRROLATE 0.2 MG/ML IJ SOLN
INTRAMUSCULAR | Status: DC | PRN
  Administered 2014-12-04: 0.2 mg via INTRAVENOUS

## 2014-12-04 MED ORDER — HYDROMORPHONE HCL 1 MG/ML IJ SOLN
0.5000 mg | INTRAMUSCULAR | Status: AC | PRN
  Administered 2014-12-04 (×4): 0.5 mg via INTRAVENOUS

## 2014-12-04 MED ORDER — LACTATED RINGERS IV SOLN: INTRAVENOUS | Status: DC

## 2014-12-04 MED ORDER — ACETAMINOPHEN 10 MG/ML IV SOLN
INTRAVENOUS | Status: AC
  Filled 2014-12-04: qty 100

## 2014-12-04 MED ORDER — ONDANSETRON HCL 4 MG/2ML IJ SOLN
INTRAMUSCULAR | Status: AC
  Filled 2014-12-04: qty 2

## 2014-12-04 MED ORDER — HYDROMORPHONE HCL 1 MG/ML IJ SOLN
INTRAMUSCULAR | Status: AC
  Filled 2014-12-04: qty 1

## 2014-12-04 MED ORDER — SODIUM CHLORIDE 0.9 % IR SOLN
Status: DC | PRN
  Administered 2014-12-04: 3297 mL

## 2014-12-04 MED ORDER — GENTAMICIN SULFATE 40 MG/ML IJ SOLN
360.0000 mg | Freq: Once | INTRAVENOUS | Status: DC
  Filled 2014-12-04: qty 9

## 2014-12-04 MED ORDER — CLINDAMYCIN PHOSPHATE 900 MG/50ML IV SOLN
INTRAVENOUS | Status: AC
  Administered 2014-12-04: 900 mg
  Filled 2014-12-04: qty 50

## 2014-12-04 MED ORDER — SILVER NITRATE-POT NITRATE 75-25 % EX MISC
CUTANEOUS | Status: DC | PRN
  Administered 2014-12-04: 6 via TOPICAL

## 2014-12-04 MED ORDER — ONDANSETRON HCL 4 MG/2ML IJ SOLN
INTRAMUSCULAR | Status: DC | PRN
  Administered 2014-12-04: 4 mg via INTRAVENOUS

## 2014-12-04 MED ORDER — HYDROMORPHONE HCL 1 MG/ML IJ SOLN
INTRAMUSCULAR | Status: DC | PRN
  Administered 2014-12-04 (×2): 0.5 mg via INTRAVENOUS

## 2014-12-04 MED ORDER — MIDAZOLAM HCL 2 MG/2ML IJ SOLN
INTRAMUSCULAR | Status: DC | PRN
  Administered 2014-12-04: 2 mg via INTRAVENOUS

## 2014-12-04 SURGICAL SUPPLY — 23 items
ABLATOR ENDOMETRIAL MYOSURE (ABLATOR) ×1 IMPLANT
CANISTER SUC SOCK COL 7IN (MISCELLANEOUS) ×2 IMPLANT
CANISTER SUCT 3000ML (MISCELLANEOUS) ×2 IMPLANT
CATH ROBINSON RED A/P 16FR (CATHETERS) ×2 IMPLANT
DRAPE SHEET LG 3/4 BI-LAMINATE (DRAPES) ×1 IMPLANT
GAUZE PETROLATUM 1 X8 (GAUZE/BANDAGES/DRESSINGS) ×1 IMPLANT
GLOVE BIO SURGEON STRL SZ8 (GLOVE) ×4 IMPLANT
GOWN STRL REUS W/ TWL LRG LVL3 (GOWN DISPOSABLE) ×1 IMPLANT
GOWN STRL REUS W/ TWL XL LVL3 (GOWN DISPOSABLE) ×1 IMPLANT
GOWN STRL REUS W/TWL LRG LVL3 (GOWN DISPOSABLE) ×2
GOWN STRL REUS W/TWL XL LVL3 (GOWN DISPOSABLE) ×2
KIT RM TURNOVER CYSTO AR (KITS) ×2 IMPLANT
MYOSURE LITE POLYP REMOVAL (MISCELLANEOUS) ×1 IMPLANT
PACK DNC HYST (MISCELLANEOUS) ×2 IMPLANT
PAD OB MATERNITY 4.3X12.25 (PERSONAL CARE ITEMS) ×2 IMPLANT
PAD PREP 24X41 OB/GYN DISP (PERSONAL CARE ITEMS) ×2 IMPLANT
SOL .9 NS 3000ML IRR  AL (IV SOLUTION) ×2
SOL .9 NS 3000ML IRR AL (IV SOLUTION) ×2
SOL .9 NS 3000ML IRR UROMATIC (IV SOLUTION) ×1 IMPLANT
SUT VIC AB 2-0 UR6 27 (SUTURE) ×1 IMPLANT
TOWEL OR 17X26 4PK STRL BLUE (TOWEL DISPOSABLE) ×2 IMPLANT
TUBING CONNECTING 10 (TUBING) ×2 IMPLANT
TUBING HYSTEROSCOPY DOLPHIN (MISCELLANEOUS) ×2 IMPLANT

## 2014-12-04 NOTE — Anesthesia Procedure Notes (Signed)
Procedure Name: LMA Insertion Date/Time: 12/04/2014 7:40 AM Performed by: Aline Brochure Pre-anesthesia Checklist: Patient identified, Emergency Drugs available, Suction available and Patient being monitored Patient Re-evaluated:Patient Re-evaluated prior to inductionOxygen Delivery Method: Circle system utilized Preoxygenation: Pre-oxygenation with 100% oxygen Intubation Type: IV induction Ventilation: Mask ventilation without difficulty LMA: LMA inserted LMA Size: 3.5 Number of attempts: 1 Airway Equipment and Method: Patient positioned with wedge pillow Placement Confirmation: positive ETCO2 and breath sounds checked- equal and bilateral Tube secured with: Tape Dental Injury: Teeth and Oropharynx as per pre-operative assessment

## 2014-12-04 NOTE — Brief Op Note (Signed)
12/04/2014  8:25 AM  PATIENT:  Rebekah Myers  59 y.o. female  PRE-OPERATIVE DIAGNOSIS:  ENDOMETRIAL POLYP  POST-OPERATIVE DIAGNOSIS:  ENDOMETRIAL POLYP  PROCEDURE:  Procedure(s): DILATATION & CURETTAGE/HYSTEROSCOPY WITH MYOSURE (N/A)  SURGEON:  Surgeon(s) and Role:    Boykin Nearing, MD - Primary  PHYSICIAN ASSISTANT:   ASSISTANTS: none   ANESTHESIA:   general  EBL:  Total I/O In: 600 [I.V.:600] Out: 170 [Urine:150; Blood:20]  BLOOD ADMINISTERED:none  DRAINS: none   LOCAL MEDICATIONS USED:  NONE  SPECIMEN:  Source of Specimen:  endometrial polyp and endometrial currettings  DISPOSITION OF SPECIMEN:  PATHOLOGY  COUNTS:  YES  TOURNIQUET:  * No tourniquets in log *  DICTATION: .Other Dictation: Dictation Number verbal   PLAN OF CARE: Discharge to home after PACU  PATIENT DISPOSITION:  PACU - hemodynamically stable.   Delay start of Pharmacological VTE agent (>24hrs) due to surgical blood loss or risk of bleeding: not applicable

## 2014-12-04 NOTE — Transfer of Care (Signed)
Immediate Anesthesia Transfer of Care Note  Patient: Rebekah Myers  Procedure(s) Performed: Procedure(s): DILATATION & CURETTAGE/HYSTEROSCOPY WITH MYOSURE (N/A)  Patient Location: PACU  Anesthesia Type:General  Level of Consciousness: awake  Airway & Oxygen Therapy: Patient Spontanous Breathing and Patient connected to face mask oxygen  Post-op Assessment: Report given to RN and Post -op Vital signs reviewed and stable  Post vital signs: stable  Last Vitals:  Filed Vitals:   12/04/14 0837  BP: 93/55  Pulse: 74  Temp: 36.1 C  Resp: 14    Complications: No apparent anesthesia complications

## 2014-12-04 NOTE — Discharge Instructions (Signed)
Lisanti Md if heavy vaginal bleeding , fever , increasing abdominal pain  Nausea / vomiting

## 2014-12-04 NOTE — Op Note (Signed)
Rebekah Myers, Rebekah Myers NO.:  [id number]  MEDICAL RECORD NO.:  [id number]  LOCATION:  [facility]                         FACILITY:  [facility]  PHYSICIAN:  Laverta Baltimore, MDDATE OF BIRTH:  05-Sep-1955  DATE OF PROCEDURE:  12/04/2014 DATE OF DISCHARGE:  12/04/2014                              OPERATIVE REPORT   PREOPERATIVE DIAGNOSIS: 1. Postmenopausal bleeding. 2. Endometrial polyp.  POSTOPERATIVE DIAGNOSIS: 1. Postmenopausal bleeding. 2. Endometrial polyp.  PROCEDURE: 1. Endometrial polyp resection with the MyoSure. 2. Endometrial curettage.  SURGEON:  Laverta Baltimore, MD  ANESTHESIA:  General endotracheal anesthesia.  INDICATION:  This is a 59 year old, gravida 1, para 1 patient with postmenopausal bleeding and noted to have a large endometrial polyp measuring 2.1 x 0.4 cm on saline infusion sonohysterography.  DESCRIPTION OF PROCEDURE:  After adequate general endotracheal anesthesia, the patient was placed in dorsal supine position.  The patient's legs were placed in the candy-cane stirrups.  The lower abdomen, perineal, and vaginal prep was performed.  The patient did receive 900 mg clindamycin as prophylaxis before given her penicillin allergy.  A time-out was performed.  A straight catheterization of the bladder yielded 150 mL of clear urine.  A weighted speculum was placed in the posterior vaginal vault, and the anterior cervix was grasped with a single-tooth tenaculum.  The cervix was then dilated to #17 Hanks dilator.  The endometrial polyp forceps were used to grab the polyp which was placed on traction.  The polyp did not adequately release from its base.  Hysteroscopy revealed a large endometrial polyp running the length of the endometrial cavity with a thick base.  The MyoSure instrument was brought up to the operative field and with normal saline as the distending medium, the MyoSure was advanced into the endometrial cavity and after 10  minutes, the total polyp was resected except for the small nub at the fundal portion which could not be reached by the tip of the MyoSure.  Endometrial curettage was then performed and no additional tissue.  Good hemostasis was noted.  The posterior tenaculum site was oozing that required one 2-0 Vicryl suture to control hemostasis, and at the end of the case, good hemostasis was noted.  There were no complications.  The patient tolerated the procedure well.  Distending medium normal saline used 3297 mL with a deficit of 380 mL.  ESTIMATED BLOOD LOSS:  20 mL.  URINE OUTPUT:  150 mL.  INTRAOPERATIVE FLUID:  600 mL.  The patient tolerated the procedure well and was taken to the recovery room in good condition.          ______________________________ Laverta Baltimore, MD     TS/MEDQ  D:  12/04/2014  T:  12/04/2014  Job:  [id number]

## 2014-12-04 NOTE — Anesthesia Preprocedure Evaluation (Addendum)
Anesthesia Evaluation  Patient identified by MRN, date of birth, ID band Patient awake    Reviewed: Allergy & Precautions, NPO status , Patient's Chart, lab work & pertinent test results, reviewed documented beta blocker date and time   Airway Mallampati: II  TM Distance: >3 FB     Dental  (+) Chipped   Pulmonary pneumonia -, resolved, former smoker,          Cardiovascular     Neuro/Psych  Neuromuscular disease    GI/Hepatic GERD-  Medicated and Controlled,  Endo/Other    Renal/GU      Musculoskeletal  (+) Arthritis -, Fibromyalgia -  Abdominal   Peds  Hematology   Anesthesia Other Findings Pt hhad a shaking episode with fentanyl, does not sound like an allergy. However, will avoid.  Reproductive/Obstetrics                            Anesthesia Physical Anesthesia Plan  ASA: III  Anesthesia Plan: General   Post-op Pain Management:    Induction: Intravenous  Airway Management Planned: LMA  Additional Equipment:   Intra-op Plan:   Post-operative Plan:   Informed Consent: I have reviewed the patients History and Physical, chart, labs and discussed the procedure including the risks, benefits and alternatives for the proposed anesthesia with the patient or authorized representative who has indicated his/her understanding and acceptance.     Plan Discussed with: CRNA  Anesthesia Plan Comments:         Anesthesia Quick Evaluation

## 2014-12-04 NOTE — Progress Notes (Signed)
Pt interviewed . NPO . Ready for endometrial polyp resection

## 2014-12-04 NOTE — Anesthesia Postprocedure Evaluation (Signed)
  Anesthesia Post-op Note  Patient: Rebekah Myers  Procedure(s) Performed: Procedure(s): DILATATION & CURETTAGE/HYSTEROSCOPY WITH MYOSURE (N/A)  Anesthesia type:General  Patient location: PACU  Post pain: Pain level controlled  Post assessment: Post-op Vital signs reviewed, Patient's Cardiovascular Status Stable, Respiratory Function Stable, Patent Airway and No signs of Nausea or vomiting  Post vital signs: Reviewed and stable  Last Vitals:  Filed Vitals:   12/04/14 1010  BP: 118/56  Pulse: 62  Temp: 36.4 C  Resp: 16    Level of consciousness: awake, alert  and patient cooperative  Complications: No apparent anesthesia complications

## 2014-12-04 NOTE — Progress Notes (Signed)
Patient stated that she has chest pain on the right side. Dr. Marcello Moores called to see patient. He arrived and reviewed the patients history. She confirmed that the chest pain is the same as she has had in the past. She stats it was no a "terrible" pain but a presence. Dr. Marcello Moores asked her if she would like a cardiology consult. The patient declined. He asked her to please Pham if the pain is increased or changed from her previous assessment. She verbalized understanding of the information. The husband was at her side.

## 2014-12-06 LAB — SURGICAL PATHOLOGY

## 2015-10-17 IMAGING — CR DG CHEST 2V
1 series · 2 of 2 positions shown · non-contrast
Comparison: Prior chest x-ray 08/10/2012

CLINICAL DATA: Nonproductive cough

EXAM:
CHEST  2 VIEW

[Series 1: kdxr chest pa (or ap) and lat · 0.14mm/px · 2 of 2 slices shown]
[im 1/2]
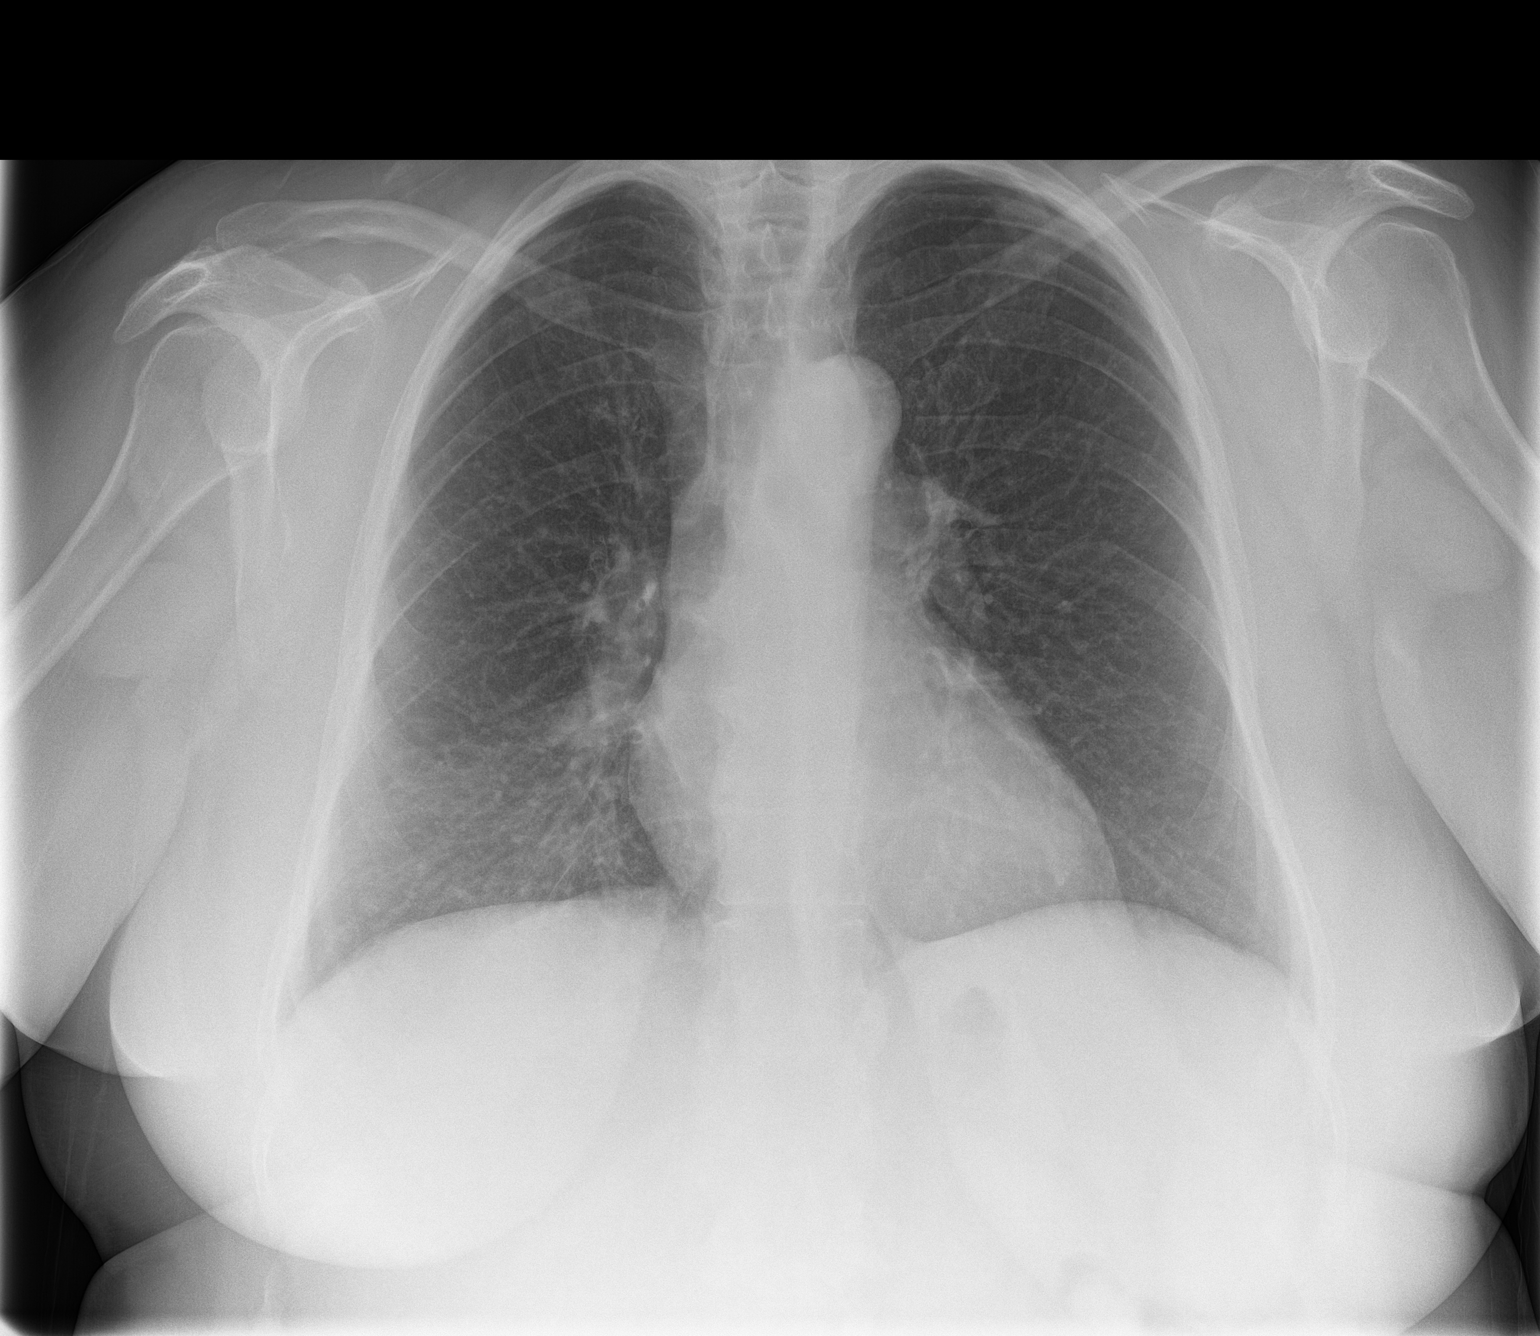
[im 2/2]
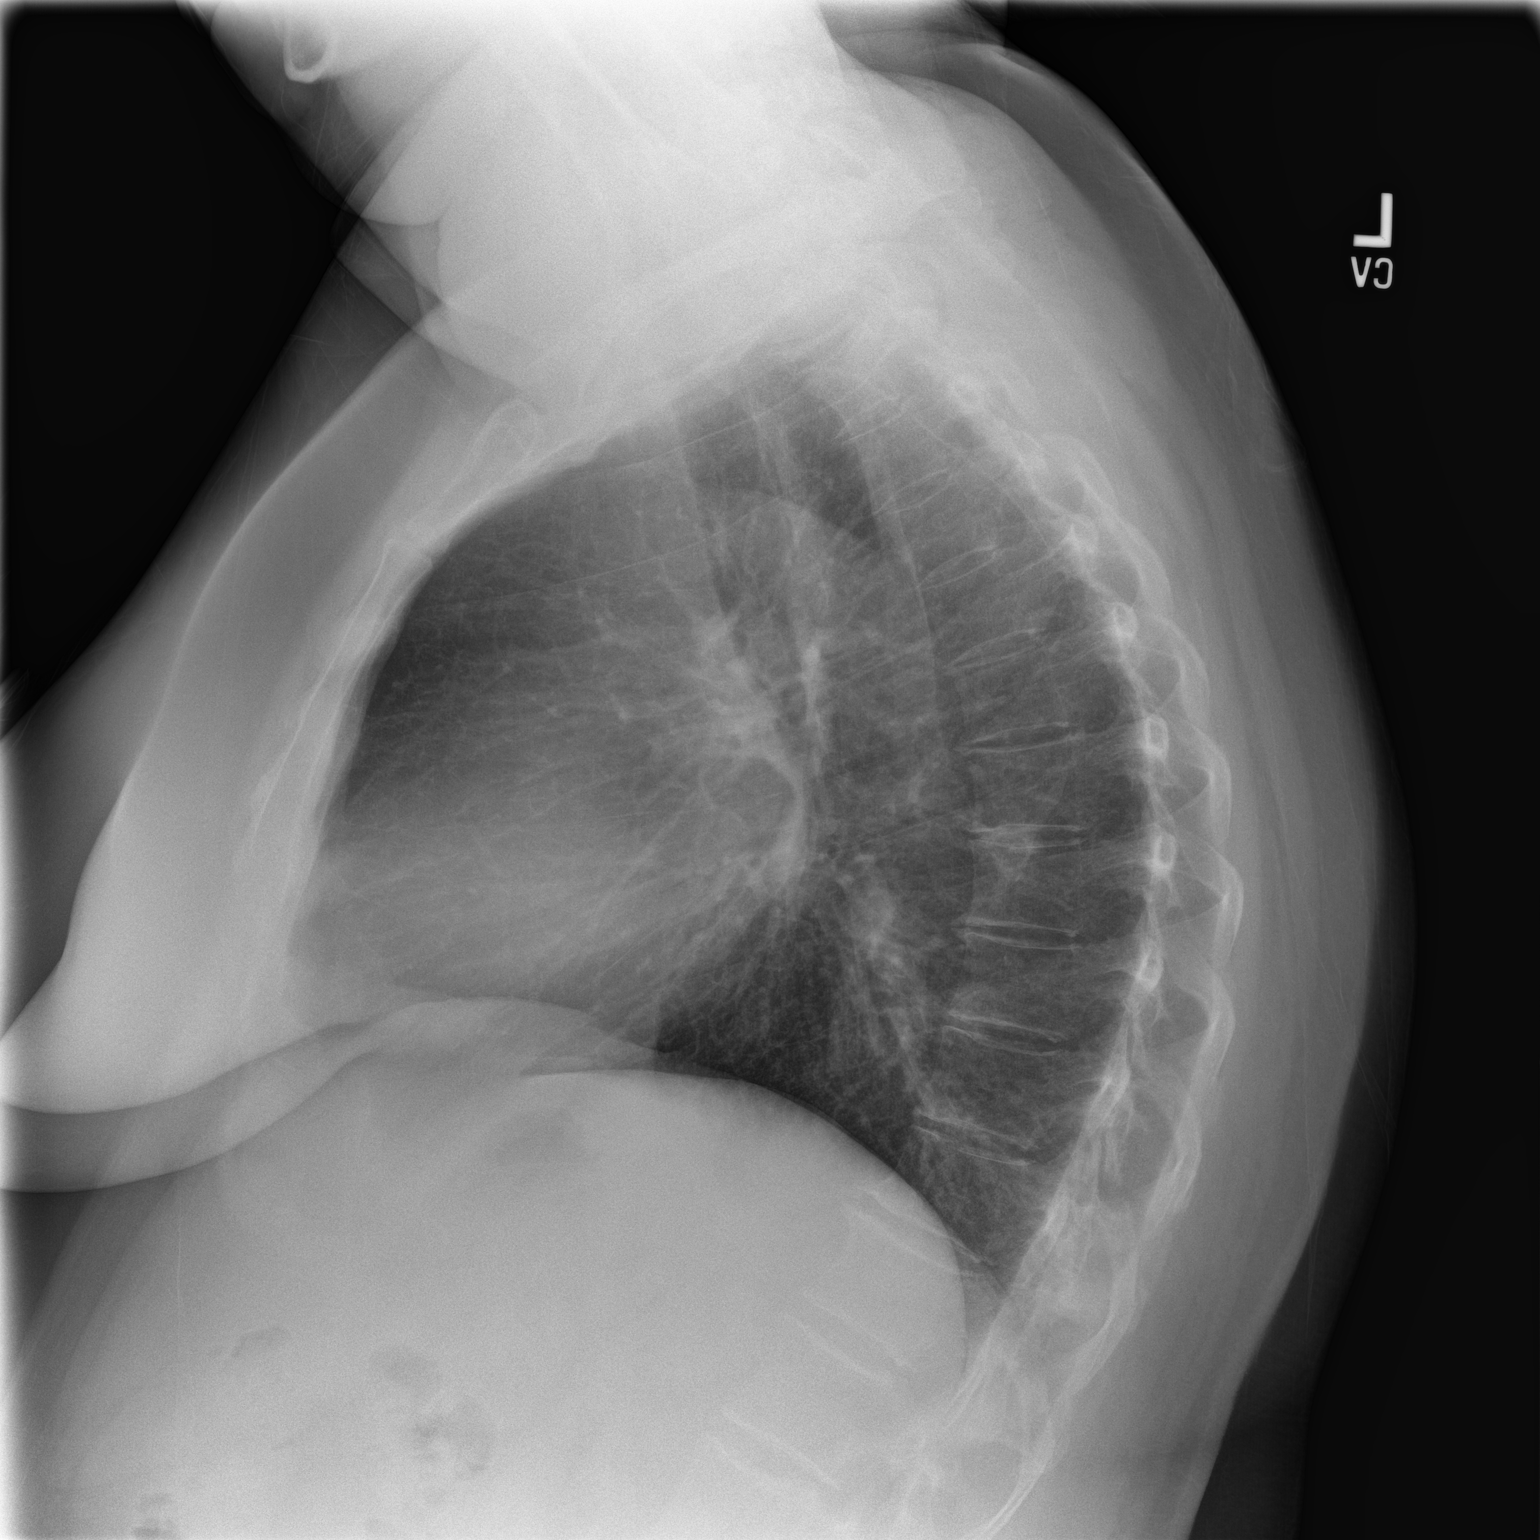

[2 of 2 positions shown; findings below may reference images not displayed]

FINDINGS: The lungs are clear and negative for focal airspace consolidation,
pulmonary edema or suspicious pulmonary nodule. No pleural effusion
or pneumothorax. Cardiac and mediastinal contours are within normal
limits. No acute fracture or lytic or blastic osseous lesions. The
visualized upper abdominal bowel gas pattern is unremarkable.
IMPRESSION: No active cardiopulmonary disease.

## 2016-02-05 ENCOUNTER — Emergency Department: Payer: BLUE CROSS/BLUE SHIELD

## 2016-02-05 ENCOUNTER — Encounter: Payer: Self-pay | Admitting: Emergency Medicine

## 2016-02-05 ENCOUNTER — Emergency Department
Admission: EM | Admit: 2016-02-05 | Discharge: 2016-02-05 | Disposition: A | Discharge status: Home / Self Care | Payer: BLUE CROSS/BLUE SHIELD | Attending: Emergency Medicine | Admitting: Emergency Medicine

## 2016-02-05 DIAGNOSIS — Z791 Long term (current) use of non-steroidal anti-inflammatories (NSAID): Secondary | ICD-10-CM | POA: Insufficient documentation

## 2016-02-05 DIAGNOSIS — Z87891 Personal history of nicotine dependence: Secondary | ICD-10-CM | POA: Insufficient documentation

## 2016-02-05 DIAGNOSIS — K5792 Diverticulitis of intestine, part unspecified, without perforation or abscess without bleeding: Secondary | ICD-10-CM | POA: Diagnosis not present

## 2016-02-05 DIAGNOSIS — K5732 Diverticulitis of large intestine without perforation or abscess without bleeding: Secondary | ICD-10-CM | POA: Diagnosis not present

## 2016-02-05 DIAGNOSIS — Z8551 Personal history of malignant neoplasm of bladder: Secondary | ICD-10-CM | POA: Insufficient documentation

## 2016-02-05 DIAGNOSIS — R1011 Right upper quadrant pain: Secondary | ICD-10-CM | POA: Diagnosis not present

## 2016-02-05 DIAGNOSIS — R109 Unspecified abdominal pain: Secondary | ICD-10-CM

## 2016-02-05 LAB — COMPREHENSIVE METABOLIC PANEL
ALT: 20 U/L (ref 14–54)
AST: 25 U/L (ref 15–41)
Albumin: 4.2 g/dL (ref 3.5–5.0)
Alkaline Phosphatase: 111 U/L (ref 38–126)
Anion gap: 10 (ref 5–15)
BUN: 16 mg/dL (ref 6–20)
CO2: 24 mmol/L (ref 22–32)
Calcium: 9.2 mg/dL (ref 8.9–10.3)
Chloride: 104 mmol/L (ref 101–111)
Creatinine, Ser: 0.7 mg/dL (ref 0.44–1.00)
GFR calc Af Amer: >60 mL/min (ref >60)
GFR calc non Af Amer: >60 mL/min (ref >60)
Glucose, Bld: 214 mg/dL — ABNORMAL HIGH (ref 65–99)
Potassium: 3.5 mmol/L (ref 3.5–5.1)
Sodium: 138 mmol/L (ref 135–145)
Total Bilirubin: 1 mg/dL (ref 0.3–1.2)
Total Protein: 7.7 g/dL (ref 6.5–8.1)

## 2016-02-05 LAB — URINALYSIS COMPLETE WITH MICROSCOPIC (ARMC ONLY)
Bacteria, UA: NONE SEEN
Bilirubin Urine: NEGATIVE
Glucose, UA: 50 mg/dL — AB
Hgb urine dipstick: NEGATIVE
Leukocytes, UA: NEGATIVE
Nitrite: NEGATIVE
Protein, ur: 30 mg/dL — AB
Specific Gravity, Urine: 1.034 — ABNORMAL HIGH (ref 1.005–1.030)
pH: 5 (ref 5.0–8.0)

## 2016-02-05 LAB — CBC
HCT: 40.2 % (ref 35.0–47.0)
Hemoglobin: 14 g/dL (ref 12.0–16.0)
MCH: 30.2 pg (ref 26.0–34.0)
MCHC: 34.9 g/dL (ref 32.0–36.0)
MCV: 86.6 fL (ref 80.0–100.0)
Platelets: 207 10*3/uL (ref 150–440)
RBC: 4.64 MIL/uL (ref 3.80–5.20)
RDW: 13.4 % (ref 11.5–14.5)
WBC: 11.8 10*3/uL — ABNORMAL HIGH (ref 3.6–11.0)

## 2016-02-05 LAB — LIPASE, BLOOD: Lipase: 30 U/L (ref 11–51)

## 2016-02-05 LAB — TROPONIN I: Troponin I: <0.03 ng/mL (ref <0.03)

## 2016-02-05 MED ORDER — OXYCODONE-ACETAMINOPHEN 5-325 MG PO TABS: 1.0000 | ORAL_TABLET | ORAL | 0 refills | Status: DC | PRN

## 2016-02-05 MED ORDER — IOPAMIDOL (ISOVUE-300) INJECTION 61%
30.0000 mL | Freq: Once | INTRAVENOUS | Status: AC | PRN
  Administered 2016-02-05: 30 mL via ORAL

## 2016-02-05 MED ORDER — MORPHINE SULFATE (PF) 2 MG/ML IV SOLN
4.0000 mg | Freq: Once | INTRAVENOUS | Status: AC
  Administered 2016-02-05: 2 mg via INTRAVENOUS
  Filled 2016-02-05: qty 2

## 2016-02-05 MED ORDER — HYDROMORPHONE HCL 1 MG/ML IJ SOLN
0.5000 mg | Freq: Once | INTRAMUSCULAR | Status: AC
  Administered 2016-02-05: 0.5 mg via INTRAVENOUS
  Filled 2016-02-05: qty 1

## 2016-02-05 MED ORDER — ONDANSETRON HCL 4 MG/2ML IJ SOLN
4.0000 mg | Freq: Once | INTRAMUSCULAR | Status: AC
  Administered 2016-02-05: 4 mg via INTRAVENOUS

## 2016-02-05 MED ORDER — METRONIDAZOLE 500 MG PO TABS: 500.0000 mg | ORAL_TABLET | Freq: Three times a day (TID) | ORAL | 0 refills | Status: AC

## 2016-02-05 MED ORDER — SODIUM CHLORIDE 0.9 % IV BOLUS (SEPSIS)
1000.0000 mL | Freq: Once | INTRAVENOUS | Status: AC
  Administered 2016-02-05: 1000 mL via INTRAVENOUS

## 2016-02-05 MED ORDER — CIPROFLOXACIN HCL 500 MG PO TABS: 500.0000 mg | ORAL_TABLET | Freq: Two times a day (BID) | ORAL | 0 refills | Status: AC

## 2016-02-05 MED ORDER — FENTANYL CITRATE (PF) 100 MCG/2ML IJ SOLN
INTRAMUSCULAR | Status: AC
  Administered 2016-02-05: 50 ug
  Filled 2016-02-05: qty 2

## 2016-02-05 MED ORDER — ONDANSETRON HCL 4 MG PO TABS: 4.0000 mg | ORAL_TABLET | Freq: Three times a day (TID) | ORAL | 0 refills | Status: DC | PRN

## 2016-02-05 MED ORDER — IOPAMIDOL (ISOVUE-300) INJECTION 61%
100.0000 mL | Freq: Once | INTRAVENOUS | Status: AC | PRN
  Administered 2016-02-05: 100 mL via INTRAVENOUS

## 2016-02-05 MED ORDER — MORPHINE SULFATE (PF) 2 MG/ML IV SOLN
2.0000 mg | Freq: Once | INTRAVENOUS | Status: AC
  Administered 2016-02-05: 2 mg via INTRAVENOUS

## 2016-02-05 MED ORDER — ONDANSETRON HCL 4 MG/2ML IJ SOLN
INTRAMUSCULAR | Status: AC
  Administered 2016-02-05: 4 mg via INTRAVENOUS
  Filled 2016-02-05: qty 2

## 2016-02-05 NOTE — ED Notes (Signed)
Patient transported to Ultrasound 

## 2016-02-05 NOTE — ED Notes (Signed)
Patient transported to CT 

## 2016-02-05 NOTE — ED Provider Notes (Signed)
Rebekah Myers, attending physician, personally viewed and interpreted this EKG  EKG Time: 1605 Rate: 86 Rhythm: normal sinus rhythm Axis: right axis deviation Intervals: qtc 608 QRS: narrow ST changes: no st elevation, t wave inversion V1 Impression: abnormal ekg  Trop negative  CT abd/pel IMPRESSION:  Uncomplicated diverticulitis at the hepatic flexure.    Discussed finding with patient. Will discharge with antibiotics, pain medication and nausea medication. Discussed return precautions.   Nance Pear, MD 02/05/16 [phone]

## 2016-02-05 NOTE — ED Notes (Signed)
Pt resting in bed, friend at bedside, resp even and unlabored, will continue to monitor

## 2016-02-05 NOTE — ED Notes (Signed)
Pt returned from US, resting in bed in no distress 

## 2016-02-05 NOTE — Discharge Instructions (Signed)
Please seek medical attention for any high fevers, chest pain, shortness of breath, change in behavior, persistent vomiting, bloody stool or any other new or concerning symptoms.  

## 2016-02-05 NOTE — ED Provider Notes (Signed)
[facility] Emergency Department Provider Note   ____________________________________________   First MD Initiated Contact with Patient 02/05/16 1148     (approximate)  I have reviewed the triage vital signs and the nursing notes.   HISTORY  Chief Complaint Abdominal Pain    HPI Rebekah Myers is a 60 y.o. female istory of previous bladder cancer who notes sh abdominal pain in the right upper abdomen since 7:30 this morning. She woke up feeling slightly nauseated, after eating breakfast she became having moderate pain to severe pain in the right upper abdomen. Associated with nausea and vomiting twice. She reports she vomited up her breakfast.  No chest pain or trouble breathing. No lower abdominal pain. No vaginal bleeding.  Reports after receiving nausea medicine and pain medicine out front she feels muer but continues to have mild discomfort located in the right mid to right upper abdomen.   Past Medical History:  Diagnosis Date  . Arthritis   . Biliary colic [id number]  . Bladder cancer ([place]) 2008  . Bladder cancer ([place])   . Bronchitis   . Diverticulosis 07/19/2014  . Elevated liver enzymes 07/19/14  . Enthesopathy of hip 07/19/2009  . Fatigue   . Fibromyalgia 2014  . Gastritis   . GERD (gastroesophageal reflux disease)   . Hyperlipidemia 06/28/2009   mixed  . PMB (postmenopausal bleeding)   . Pneumonia 2015  . Postmenopause bleeding   . Vitamin D deficiency 06/28/2009    Patient Active Problem List   Diagnosis Date Noted  . Malignant neoplasm of bladder ([place]) 10/23/2014  . Bronchitis 10/23/2014  . Chest wall pain 10/23/2014  . Encounter for screening for malignant neoplasm of colon 10/23/2014  . DD (diverticular disease) 10/23/2014  . Abnormal liver enzymes 10/23/2014  . Fatigue 10/23/2014  . Acute gastritis 10/23/2014  . Esophageal reflux 10/23/2014  . PNA (pneumonia) 10/23/2014  . Hemorrhage, postmenopausal 10/23/2014  . Muscle  ache 10/23/2014  . Viral bronchitis 10/23/2014  . Asthmatic breathing 10/23/2014  . Enthesopathy of hip 07/19/2009  . Combined fat and carbohydrate induced hyperlipemia 06/28/2009  . Avitaminosis D 06/28/2009  . Personal history of malignant neoplasm of bladder 06/15/2009  . Arthralgia of hip or thigh 06/15/2009    Past Surgical History:  Procedure Laterality Date  . [place] cancer  (2007) 2008   as stated [place]/Urology in [facility]. BCG treatments weekly x21 followed every year.  Marland Kitchen DILATATION & CURETTAGE/HYSTEROSCOPY WITH MYOSURE N/A 12/04/2014   Procedure: DILATATION & CURETTAGE/HYSTEROSCOPY WITH MYOSURE;  Surgeon: Boykin Nearing, MD;  Location: [facility] ORS;  Service: Gynecology;  Laterality: N/A;  . Right Toe fracture Right 2009   Right First Toe surgery secondary to fracture    Prior to Admission medications   Medication Sig Start Date End Date Taking? Authorizing Provider  ibuprofen (ADVIL,MOTRIN) 200 MG tablet Take 200 mg by mouth every 6 (six) hours as needed.   Yes Historical Provider, MD    Allergies Penicillins; Fentanyl; and Sulfa antibiotics  Family History  Problem Relation Age of Onset  . Cancer Mother     breast cancer  . Depression Mother   . Cancer Father     Melanoma  . Diabetes Father   . Gout Father     Social History Social History  Substance Use Topics  . Smoking status: Former Smoker    Types: Cigarettes    Quit date: 04/08/2007  . Smokeless tobacco: Not on file  . Alcohol use No  Review of Systems Constitutional: No fever/chills Eyes: No visual changes. ENT: No sore throat. Cardiovascular: Denies chest pain. Respiratory: Denies shortness of breath. Gastrointestinal:  No diarrhea.  No constipation. Genitourinary: Negative for dysuria. Musculoskeletal: Negative for back pain. Skin: Negative for rash. Neurological: Negative for headaches, focal weakness or numbness.  10-point ROS otherwise  negative.  ____________________________________________   PHYSICAL EXAM:  VITAL SIGNS: ED Triage Vitals  Enc Vitals Group     BP 02/05/16 1100 90/67     Pulse Rate 02/05/16 1100 82     Resp 02/05/16 1100 20     Temp 02/05/16 1100 (!) 95.5 F (35.3 C)     Temp Source 02/05/16 1100 Oral     SpO2 02/05/16 1100 97 %     Weight 02/05/16 1101 195 lb (88.5 kg)     Height --      Head Circumference --      Peak Flow --      Pain Score 02/05/16 1512 7     Pain Loc --      Pain Edu? --      Excl. in [place]? --     Constitutional: Alert and oriented. Well appearing and in no acute distress. Eyes: Conjunctivae are normal. PERRL. EOMI. Head: Atraumatic. Nose: No congestion/rhinnorhea. Mouth/Throat: Mucous membranes are moist.  Oropharynx non-erythematous. Neck: No stridor.   Cardiovascular: Normal rate, regular rhythm. Grossly normal heart sounds.  Good peripheral circulation. Respiratory: Normal respiratory effort.  No retractions. Lungs CTAB. Gastrointestinal: Soft but moderate tenderness to palpation within the right flank and right upper quadrant. No CVA tenderness no pain at MCBurney's point. No distention. No abdominal bruits. No CVA tenderness. Musculoskeletal: No lower extremity tenderness nor edema.  Neurologic:  Normal speech and language. No gross focal neurologic deficits are appreciated.  Skin:  Skin is warm, dry and intact. No rash noted. Psychiatric: Mood and affect are normal. Speech and behavior are normal.  ____________________________________________   LABS (all labs ordered are listed, but only abnormal results are displayed)  Labs Reviewed  COMPREHENSIVE METABOLIC PANEL - Abnormal; Notable for the following:       Result Value   Glucose, Bld 214 (*)    All other components within normal limits  CBC - Abnormal; Notable for the following:    WBC 11.8 (*)    All other components within normal limits  URINALYSIS COMPLETEWITH MICROSCOPIC ([facility] ONLY) - Abnormal;  Notable for the following:    Color, Urine YELLOW (*)    APPearance CLEAR (*)    Glucose, UA 50 (*)    Ketones, ur TRACE (*)    Specific Gravity, Urine 1.034 (*)    Protein, ur 30 (*)    Squamous Epithelial / LPF 0-5 (*)    All other components within normal limits  LIPASE, BLOOD  TROPONIN I   ____________________________________________  EKG   ____________________________________________  RADIOLOGY  US Abdomen Limited Ruq  Result Date: 02/05/2016 CLINICAL DATA:  Right upper quadrant pain for 1 day. EXAM: US ABDOMEN LIMITED - RIGHT UPPER QUADRANT COMPARISON:  CT of 02/07/2015 FINDINGS: Gallbladder: Possible minimal gallbladder sludge versus artifact. No wall thickening or pericholecystic fluid. Sonographic Murphy's sign was not elicited. Common bile duct: Diameter: Normal, 3 mm. Liver: Increased echogenicity.  No focal liver lesion. IMPRESSION: Possible gallbladder sludge versus artifact. No acute cholecystitis or biliary duct dilatation. Hepatic steatosis. Electronically Signed   By: Abigail Miyamoto M.D.   On: 02/05/2016 13:56    ____________________________________________   PROCEDURES  Procedure(s)  performed: None  Procedures  Critical Care performed: No  ____________________________________________   INITIAL IMPRESSION / ASSESSMENT AND PLAN / ED COURSE  Pertinent labs & imaging results that were available during my care of the patient were reviewed by me and considered in my medical decision making (see chart for details).  Differential diagnosis includes but is not limited to, abdominal perforation, aortic dissection, cholecystitis, appendicitis, diverticulitis, colitis, esophagitis/gastritis, kidney stone, pyelonephritis, urinary tract infection, aortic aneurysm. All are considered in decision and treatment plan. Based upon the patient's presentation and risk factors, start by evaluating with ultrasound to look for possiblegallbladder  etiology.  ----------------------------------------- 3:45 PM on 02/05/2016 -----------------------------------------  The patient is resting comfortably. She reports her pain is much better and her nausea has improved. This as Linwood Dibbles did not show an obvious etiology we'll proceed with CT scan to further evaluate for the cause of her abdominal pain today. Ongoing care including follow-up on CT abdomen and pelvis, EKG, and troponin assigned to Dr. Archie Balboa.   Clinical Course     ____________________________________________   FINAL CLINICAL IMPRESSION(S) / ED DIAGNOSES  Final diagnoses:  Abdominal pain      NEW MEDICATIONS STARTED DURING THIS VISIT:  New Prescriptions   No medications on file     Note:  This document was prepared using Dragon voice recognition software and may include unintentional dictation errors.     Delman Kitten, MD 02/05/16 [phone]

## 2016-02-05 NOTE — ED Triage Notes (Signed)
Sudden onset of right sided abd pain with positive n/v    States pain is RUQ to RLQ

## 2016-02-05 NOTE — ED Notes (Signed)
Pt resting in bed, husband at bedside, pt awake and alert in no acute distress 

## 2016-02-05 NOTE — ED Notes (Signed)
Pt returned from CT, resting in bed, family at bedside 

## 2016-05-26 ENCOUNTER — Encounter (INDEPENDENT_AMBULATORY_CARE_PROVIDER_SITE_OTHER): Payer: Self-pay

## 2016-05-26 ENCOUNTER — Encounter: Payer: Self-pay | Admitting: Internal Medicine

## 2016-05-26 ENCOUNTER — Ambulatory Visit (INDEPENDENT_AMBULATORY_CARE_PROVIDER_SITE_OTHER): Payer: BLUE CROSS/BLUE SHIELD | Admitting: Internal Medicine

## 2016-05-26 DIAGNOSIS — C679 Malignant neoplasm of bladder, unspecified: Secondary | ICD-10-CM

## 2016-05-26 DIAGNOSIS — M797 Fibromyalgia: Secondary | ICD-10-CM | POA: Insufficient documentation

## 2016-05-26 DIAGNOSIS — E785 Hyperlipidemia, unspecified: Secondary | ICD-10-CM | POA: Insufficient documentation

## 2016-05-26 DIAGNOSIS — E7800 Pure hypercholesterolemia, unspecified: Secondary | ICD-10-CM

## 2016-05-26 NOTE — Patient Instructions (Signed)
Fat and Cholesterol Restricted Diet Introduction Getting too much fat and cholesterol in your diet may cause health problems. Following this diet helps keep your fat and cholesterol at normal levels. This can keep you from getting sick. What types of fat should I choose?  Choose monosaturated and polyunsaturated fats. These are found in foods such as olive oil, canola oil, flaxseeds, walnuts, almonds, and seeds.  Eat more omega-3 fats. Good choices include salmon, mackerel, sardines, tuna, flaxseed oil, and ground flaxseeds.  Limit saturated fats. These are in animal products such as meats, butter, and cream. They can also be in plant products such as palm oil, palm kernel oil, and coconut oil.  Avoid foods with partially hydrogenated oils in them. These contain trans fats. Examples of foods that have trans fats are stick margarine, some tub margarines, cookies, crackers, and other baked goods. What general guidelines do I need to follow?  Check food labels. Look for the words "trans fat" and "saturated fat."  When preparing a meal:  Fill half of your plate with vegetables and green salads.  Fill one fourth of your plate with whole grains. Look for the word "whole" as the first word in the ingredient list.  Fill one fourth of your plate with lean protein foods.  Eat more foods that have fiber, like apples, carrots, beans, peas, and barley.  Eat more home-cooked foods. Eat less at restaurants and buffets.  Limit or avoid alcohol.  Limit foods high in starch and sugar.  Limit fried foods.  Cook foods without frying them. Baking, boiling, grilling, and broiling are all great options.  Lose weight if you are overweight. Losing even a small amount of weight can help your overall health. It can also help prevent diseases such as diabetes and heart disease. What foods can I eat? Grains  Whole grains, such as whole wheat or whole grain breads, crackers, cereals, and pasta. Unsweetened  oatmeal, bulgur, barley, quinoa, or brown rice. Corn or whole wheat flour tortillas. Vegetables  Fresh or frozen vegetables (raw, steamed, roasted, or grilled). Green salads. Fruits  All fresh, canned (in natural juice), or frozen fruits. Meat and Other Protein Products  Ground beef (85% or leaner), grass-fed beef, or beef trimmed of fat. Skinless chicken or turkey. Ground chicken or turkey. Pork trimmed of fat. All fish and seafood. Eggs. Dried beans, peas, or lentils. Unsalted nuts or seeds. Unsalted canned or dry beans. Dairy  Low-fat dairy products, such as skim or 1% milk, 2% or reduced-fat cheeses, low-fat ricotta or cottage cheese, or plain low-fat yogurt. Fats and Oils  Tub margarines without trans fats. Light or reduced-fat mayonnaise and salad dressings. Avocado. Olive, canola, sesame, or safflower oils. Natural peanut or almond butter (choose ones without added sugar and oil). The items listed above may not be a complete list of recommended foods or beverages. Contact your dietitian for more options.  What foods are not recommended? Grains  White bread. White pasta. White rice. Cornbread. Bagels, pastries, and croissants. Crackers that contain trans fat. Vegetables  White potatoes. Corn. Creamed or fried vegetables. Vegetables in a cheese sauce. Fruits  Dried fruits. Canned fruit in light or heavy syrup. Fruit juice. Meat and Other Protein Products  Fatty cuts of meat. Ribs, chicken wings, bacon, sausage, bologna, salami, chitterlings, fatback, hot dogs, bratwurst, and packaged luncheon meats. Liver and organ meats. Dairy  Whole or 2% milk, cream, half-and-half, and cream cheese. Whole milk cheeses. Whole-fat or sweetened yogurt. Full-fat cheeses. Nondairy creamers and   whipped toppings. Processed cheese, cheese spreads, or cheese curds. Sweets and Desserts  Corn syrup, sugars, honey, and molasses. Candy. Jam and jelly. Syrup. Sweetened cereals. Cookies, pies, cakes, donuts,  muffins, and ice cream. Fats and Oils  Butter, stick margarine, lard, shortening, ghee, or bacon fat. Coconut, palm kernel, or palm oils. Beverages  Alcohol. Sweetened drinks (such as sodas, lemonade, and fruit drinks or punches). The items listed above may not be a complete list of foods and beverages to avoid. Contact your dietitian for more information.  This information is not intended to replace advice given to you by your health care provider. Make sure you discuss any questions you have with your health care provider. Document Released: 09/23/2011 Document Revised: 11/29/2015 Document Reviewed: 06/23/2013  2017 Elsevier  

## 2016-05-26 NOTE — Progress Notes (Signed)
HPI  Pt presents to the clinic today to establish care and for management of the conditions listed below. She is transferring care from [facility].  History of Bladder Cancer: s/p excision and BCG therapy. She follows with Dr. Huel Coventry.  Fibromyalgia: She occasionally has joint and muscle pains. She is not taking anything OTC.  HLD: Diet controlled. She tries to avoid a low fat diet.  Flu: 02/2015 Tetanus: 02/2012 Pneumovax: 2016, per pt Pap Smear: 2013 Mammogram: 2013 Vision Screening: as needed Dentist: as needed  Past Medical History:  Diagnosis Date  . Arthritis   . Bladder cancer ([place]) 2008  . Bladder cancer ([place])   . Diverticulosis 07/19/2014  . Fibromyalgia 2014  . History of colon polyps   . Hyperlipidemia 06/28/2009   mixed    Current Outpatient Prescriptions  Medication Sig Dispense Refill  . Aspirin-Acetaminophen-Caffeine (EXCEDRIN PO) Take 1 tablet by mouth daily as needed.     No current facility-administered medications for this visit.     Allergies  Allergen Reactions  . Penicillins Swelling    Has patient had a PCN reaction causing immediate rash, facial/tongue/throat swelling, SOB or lightheadedness with hypotension: Yes Has patient had a PCN reaction causing severe rash involving mucus membranes or skin necrosis: No Has patient had a PCN reaction that required hospitalization No Has patient had a PCN reaction occurring within the last 10 years: No If all of the above answers are "NO", then may proceed with Cephalosporin use.   . Sulfa Antibiotics Hives    Family History  Problem Relation Age of Onset  . Depression Mother   . Breast cancer Mother   . Diabetes Father   . Gout Father   . Melanoma Father     Social History   Social History  . Marital status: Married    Spouse name: N/A  . Number of children: N/A  . Years of education: N/A   Occupational History  . Not on file.   Social History Main Topics  . Smoking  status: Current Every Day Smoker    Packs/day: 0.25    Types: Cigarettes    Last attempt to quit: 04/08/2007  . Smokeless tobacco: Never Used  . Alcohol use No  . Drug use: No  . Sexual activity: Yes    Birth control/ protection: Post-menopausal   Other Topics Concern  . Not on file   Social History Narrative  . No narrative on file    ROS:  Constitutional: Denies fever, malaise, fatigue, headache or abrupt weight changes.  Respiratory: Denies difficulty breathing, shortness of breath, cough or sputum production.   Cardiovascular: Denies chest pain, chest tightness, palpitations or swelling in the hands or feet.  Gastrointestinal: Denies abdominal pain, bloating, constipation, diarrhea or blood in the stool.  GU: Denies frequency, urgency, pain with urination, blood in urine, odor or discharge. Musculoskeletal: Pt reports intermittent joint and muscle pain. Denies decrease in range of motion, difficulty with gait, or joint swelling.  Neurological: Denies dizziness, difficulty with memory, difficulty with speech or problems with balance and coordination.  Psych: Denies anxiety, depression, SI/HI.  No other specific complaints in a complete review of systems (except as listed in HPI above).  PE:  BP 118/74   Pulse 72   Temp 97.9 F (36.6 C) (Oral)   Ht 5' 6.5" (1.689 m)   Wt 198 lb (89.8 kg)   SpO2 97%   BMI 31.48 kg/m   Wt Readings from Last 3 Encounters:  05/26/16  198 lb (89.8 kg)  02/05/16 195 lb (88.5 kg)  11/29/14 195 lb (88.5 kg)    General: Appears her stated age, obese in NAD. Cardiovascular: Normal rate and rhythm. S1,S2 noted.  No murmur, rubs or gallops noted.  Pulmonary/Chest: Normal effort and positive vesicular breath sounds. No respiratory distress. No wheezes, rales or ronchi noted.  Musculoskeletal:  No difficulty with gait.  Neurological: Alert and oriented. Psychiatric: Mood and affect normal. Behavior is normal. Judgment and thought content normal.     BMET    Component Value Date/Time   NA 138 02/05/2016 1056   NA 143 07/24/2014   NA 138 07/19/2014 1906   K 3.5 02/05/2016 1056   K 3.6 07/19/2014 1906   CL 104 02/05/2016 1056   CL 105 07/19/2014 1906   CO2 24 02/05/2016 1056   CO2 25 07/19/2014 1906   GLUCOSE 214 (H) 02/05/2016 1056   GLUCOSE 130 (H) 07/19/2014 1906   BUN 16 02/05/2016 1056   BUN 11 07/24/2014   BUN 15 07/19/2014 1906   CREATININE 0.70 02/05/2016 1056   CREATININE 0.61 07/19/2014 1906   CALCIUM 9.2 02/05/2016 1056   CALCIUM 9.0 07/19/2014 1906   GFRNONAA >60 02/05/2016 1056   GFRNONAA >60 07/19/2014 1906   GFRAA >60 02/05/2016 1056   GFRAA >60 07/19/2014 1906    Lipid Panel  No results found for: CHOL, TRIG, HDL, CHOLHDL, VLDL, LDLCALC  CBC    Component Value Date/Time   WBC 11.8 (H) 02/05/2016 1056   RBC 4.64 02/05/2016 1056   HGB 14.0 02/05/2016 1056   HGB 13.8 07/19/2014 1906   HCT 40.2 02/05/2016 1056   HCT 40.5 07/19/2014 1906   PLT 207 02/05/2016 1056   PLT 228 07/19/2014 1906   MCV 86.6 02/05/2016 1056   MCV 86 07/19/2014 1906   MCH 30.2 02/05/2016 1056   MCHC 34.9 02/05/2016 1056   RDW 13.4 02/05/2016 1056   RDW 13.8 07/19/2014 1906   LYMPHSABS 2.3 07/19/2014 1906   MONOABS 0.6 07/19/2014 1906   EOSABS 0.2 07/19/2014 1906   BASOSABS 0.1 07/19/2014 1906    Hgb A1C No results found for: HGBA1C   Assessment and Plan:

## 2016-05-26 NOTE — Assessment & Plan Note (Signed)
Controlled with exercise

## 2016-05-26 NOTE — Assessment & Plan Note (Signed)
She follows with urology yearly

## 2016-05-26 NOTE — Assessment & Plan Note (Signed)
Diet controlled Will check lipid panel at annual exam

## 2016-06-10 IMAGING — CT CT ABD-PELV W/ CM
1 of 3 series · 13 of 32 positions shown, 18 images · IV contrast (omnipaque)
Comparison: Right upper quadrant ultrasound earlier this day. CT
12/02/2013

CLINICAL DATA: Severe right upper quadrant abdominal pain for 1
day. Hematuria. Patient reports history of bladder cancer.

EXAM:
CT ABDOMEN AND PELVIS WITH CONTRAST
TECHNIQUE: Multidetector CT imaging of the abdomen and pelvis was performed
using the standard protocol following bolus administration of
intravenous contrast.
CONTRAST:  100 mL Omnipaque 300 IV

[Series 2: routine abd pel with · axial · 0.75mm/px · z∈[-1134,-684]mm · 13 of 101 slices shown, 18 images]
[im 6/101  soft-tissue]
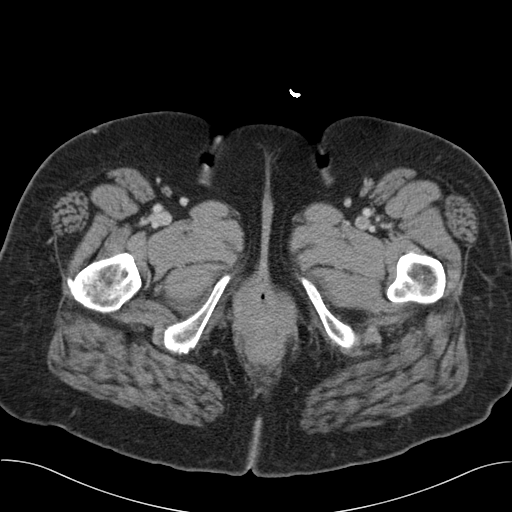
[im 6/101  bone]
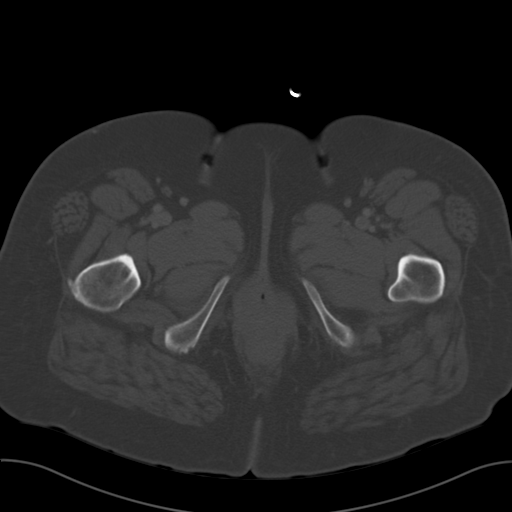
[im 16/101  soft-tissue]
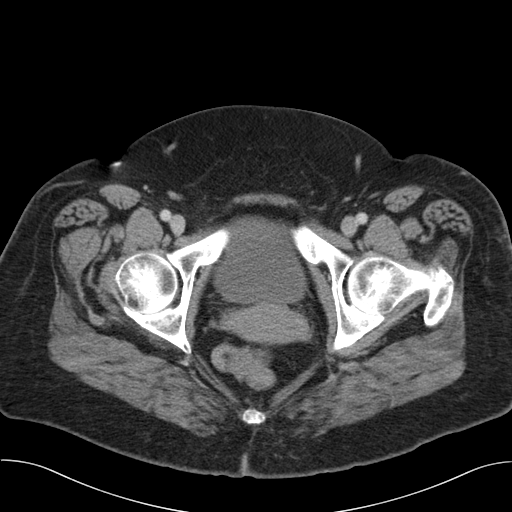
[im 21/101  soft-tissue]
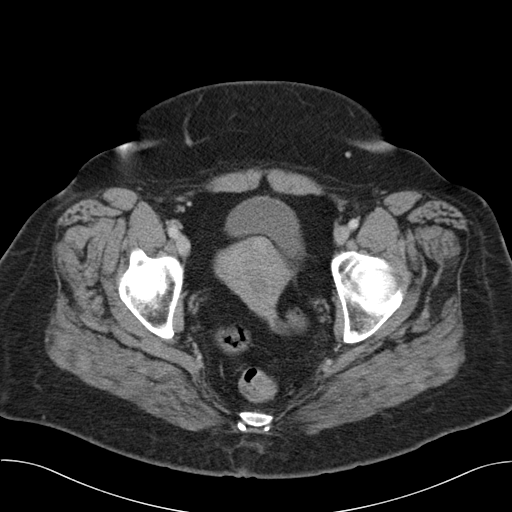
[im 31/101  soft-tissue]
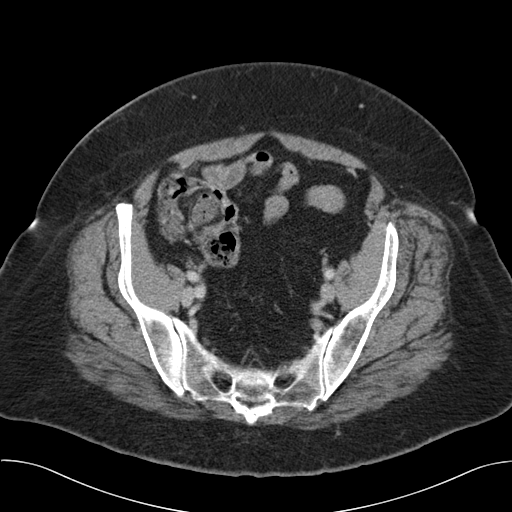
[im 41/101  soft-tissue]
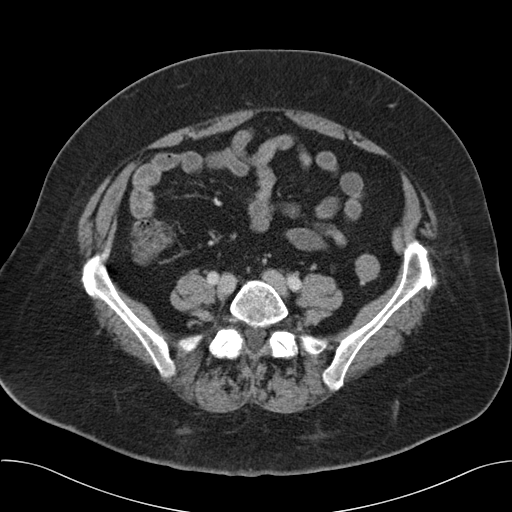
[im 46/101  soft-tissue]
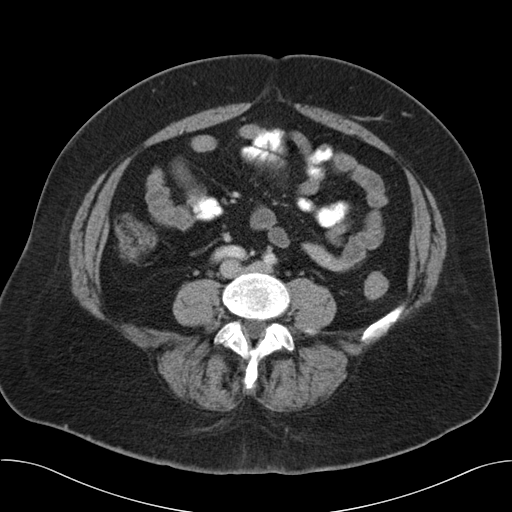
[im 56/101  soft-tissue]
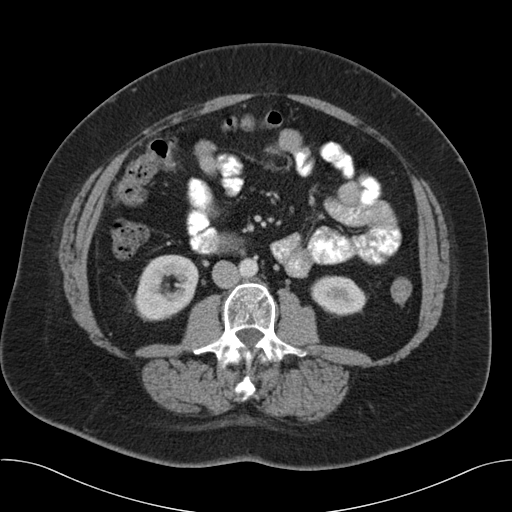
[im 61/101  soft-tissue]
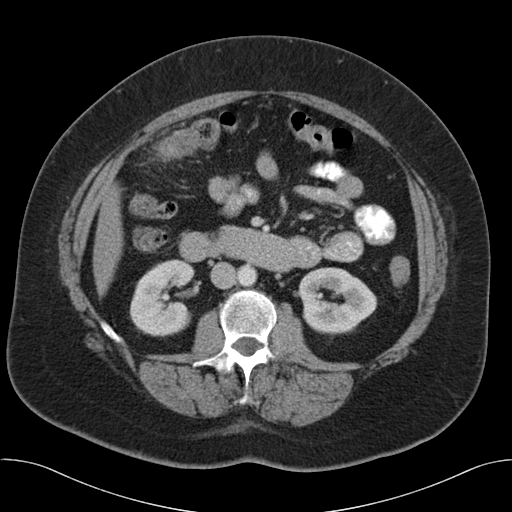
[im 71/101  soft-tissue]
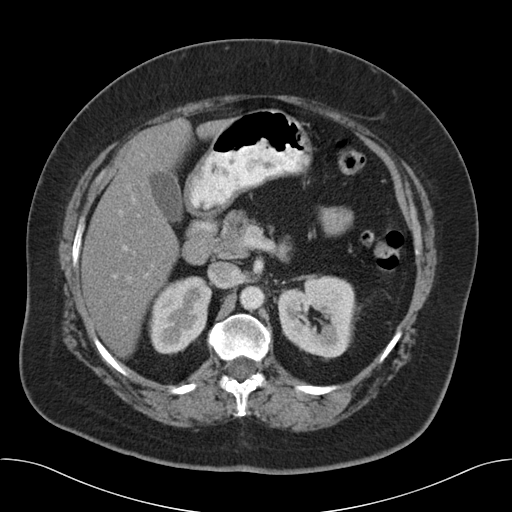
[im 71/101  bone]
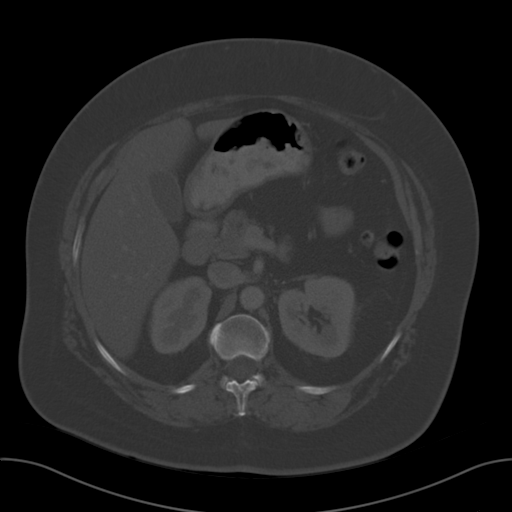
[im 81/101  soft-tissue]
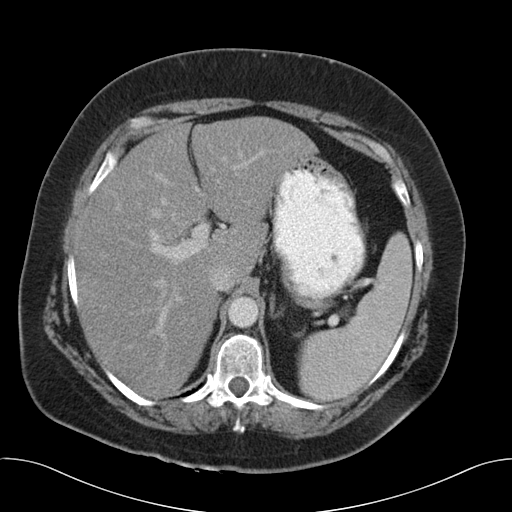
[im 81/101  lung]
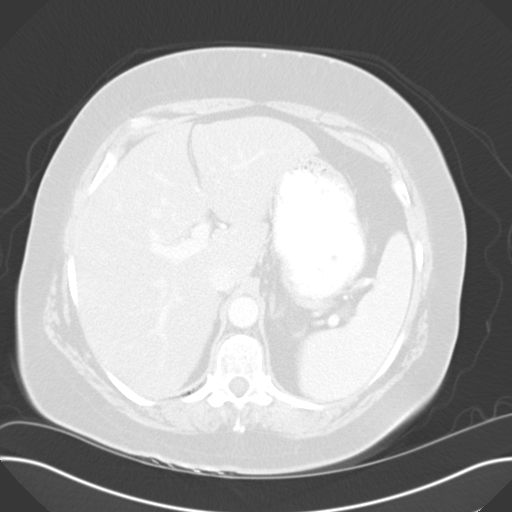
[im 86/101  soft-tissue]
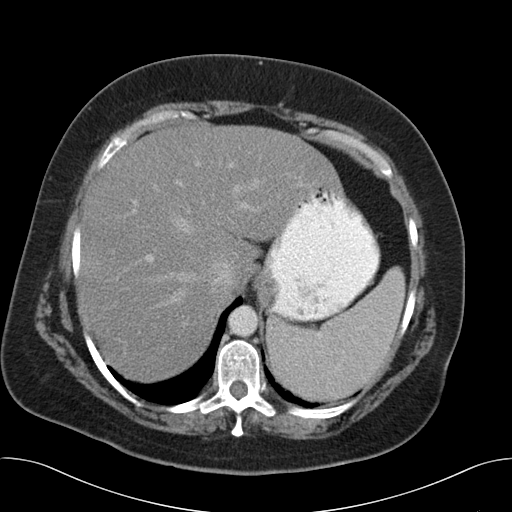
[im 86/101  lung]
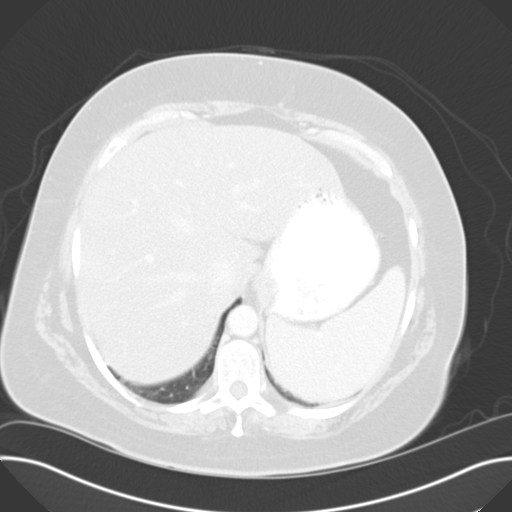
[im 91/101  lung]
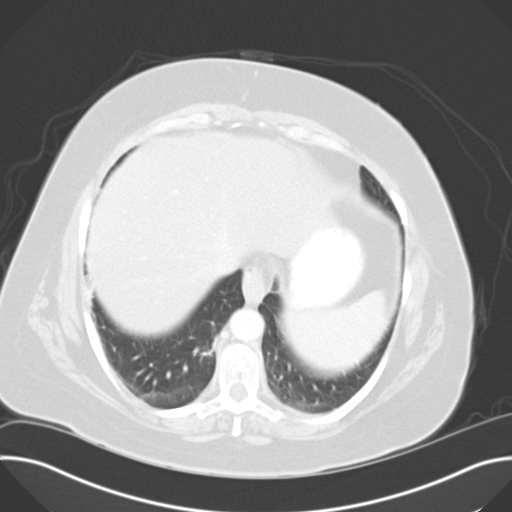
[im 96/101  soft-tissue]
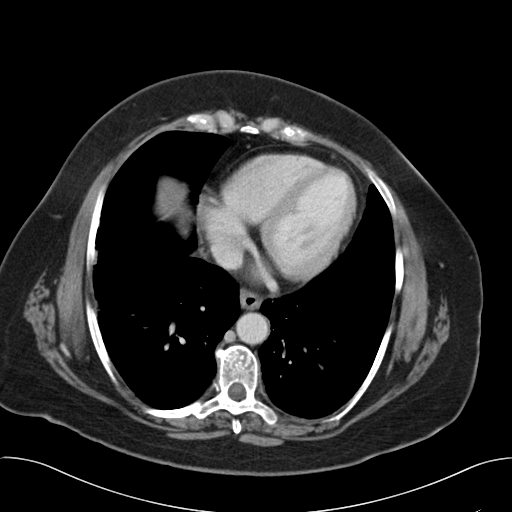
[im 96/101  lung]
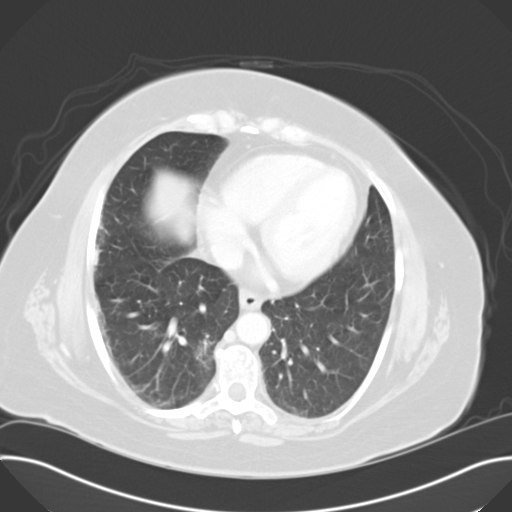

[13 of 32 positions shown; findings below may reference images not displayed]

FINDINGS: Minimal atelectasis in the right greater than left lung base. Linear
ground-glass opacity medial right lower lobe, may reflect
atelectasis versus pneumonitis. Small hiatal hernia.

There is no inflamed diverticula at the hepatic flexure of the colon
with associated pericolonic compared air reticular inflammatory
change. Findings are consistent with acute uncomplicated
diverticulitis. No perforation or abscess. Multiple additional
noninflamed diverticula are seen throughout the entire colon. The
appendix is normal. There is no small bowel dilatation. Stomach is
physiologically distended with contrast.

Liver is prominent with decreased density consistent with steatosis.
No focal hepatic lesion. Gallbladder is physiologically distended.
Spleen is mildly enlarged measuring 13.9 x 10.9 x 5.0 cm. No focal
splenic lesion. Adrenal glands and pancreas are normal. Symmetric
renal enhancement and excretion. There are bilateral renal cysts.
These are unchanged from prior.

Abdominal aorta is normal in caliber. No retroperitoneal adenopathy.
Tiny fat containing umbilical hernia.

Within the pelvis the bladder is physiologically distended. No
bladder wall thickening or lesion identified. There is a a small
anterior fundal fibroid in the uterus. Ovaries are normal in size,
no adnexal mass. There is no pelvic free fluid. There is no pelvic
adenopathy.

There are no acute or suspicious osseous abnormalities.
IMPRESSION: 1. Acute uncomplicated diverticulitis involving the hepatic flexure
of the colon likely cause of patient's right upper quadrant pain. No
perforation or abscess. Additional noninflamed diverticula are seen
throughout the entire colon.
2. Hepatic steatosis, borderline hepatomegaly. Borderline
splenomegaly.
3. Minimal linear and ground-glass opacity medial right lower lobe,
may reflect scarring or atelectasis.

## 2016-07-23 ENCOUNTER — Other Ambulatory Visit (HOSPITAL_COMMUNITY)
Admission: RE | Admit: 2016-07-23 | Discharge: 2016-07-23 | Disposition: A | Discharge status: Home / Self Care | Payer: BLUE CROSS/BLUE SHIELD | Source: Ambulatory Visit | Attending: Internal Medicine | Admitting: Internal Medicine

## 2016-07-23 ENCOUNTER — Ambulatory Visit (INDEPENDENT_AMBULATORY_CARE_PROVIDER_SITE_OTHER): Payer: BLUE CROSS/BLUE SHIELD | Admitting: Internal Medicine

## 2016-07-23 ENCOUNTER — Encounter: Payer: Self-pay | Admitting: Internal Medicine

## 2016-07-23 VITALS — BP 114/78 | HR 84 | Temp 98.1°F | Ht 66.5 in | Wt 202.0 lb

## 2016-07-23 DIAGNOSIS — Z0000 Encounter for general adult medical examination without abnormal findings: Secondary | ICD-10-CM

## 2016-07-23 DIAGNOSIS — N760 Acute vaginitis: Secondary | ICD-10-CM | POA: Diagnosis not present

## 2016-07-23 DIAGNOSIS — Z1231 Encounter for screening mammogram for malignant neoplasm of breast: Secondary | ICD-10-CM | POA: Diagnosis not present

## 2016-07-23 DIAGNOSIS — Z114 Encounter for screening for human immunodeficiency virus [HIV]: Secondary | ICD-10-CM | POA: Diagnosis not present

## 2016-07-23 DIAGNOSIS — B9689 Other specified bacterial agents as the cause of diseases classified elsewhere: Secondary | ICD-10-CM | POA: Insufficient documentation

## 2016-07-23 DIAGNOSIS — Z124 Encounter for screening for malignant neoplasm of cervix: Secondary | ICD-10-CM | POA: Diagnosis not present

## 2016-07-23 DIAGNOSIS — Z1159 Encounter for screening for other viral diseases: Secondary | ICD-10-CM | POA: Diagnosis not present

## 2016-07-23 DIAGNOSIS — E782 Mixed hyperlipidemia: Secondary | ICD-10-CM

## 2016-07-23 DIAGNOSIS — L729 Follicular cyst of the skin and subcutaneous tissue, unspecified: Secondary | ICD-10-CM

## 2016-07-23 DIAGNOSIS — Z1239 Encounter for other screening for malignant neoplasm of breast: Secondary | ICD-10-CM

## 2016-07-23 LAB — COMPREHENSIVE METABOLIC PANEL
ALT: 30 U/L (ref 0–35)
AST: 28 U/L (ref 0–37)
Albumin: 4.9 g/dL (ref 3.5–5.2)
Alkaline Phosphatase: 113 U/L (ref 39–117)
BUN: 16 mg/dL (ref 6–23)
CO2: 30 mEq/L (ref 19–32)
Calcium: 9.8 mg/dL (ref 8.4–10.5)
Chloride: 100 mEq/L (ref 96–112)
Creatinine, Ser: 0.71 mg/dL (ref 0.40–1.20)
GFR: 89.62 mL/min (ref >60.00)
Glucose, Bld: 90 mg/dL (ref 70–99)
Potassium: 3.8 mEq/L (ref 3.5–5.1)
Sodium: 138 mEq/L (ref 135–145)
Total Bilirubin: 0.8 mg/dL (ref 0.2–1.2)
Total Protein: 8.3 g/dL (ref 6.0–8.3)

## 2016-07-23 LAB — CBC
HCT: 45.2 % (ref 36.0–46.0)
Hemoglobin: 15.2 g/dL — ABNORMAL HIGH (ref 12.0–15.0)
MCHC: 33.5 g/dL (ref 30.0–36.0)
MCV: 87.5 fl (ref 78.0–100.0)
Platelets: 273 10*3/uL (ref 150.0–400.0)
RBC: 5.17 Mil/uL — ABNORMAL HIGH (ref 3.87–5.11)
RDW: 13.7 % (ref 11.5–15.5)
WBC: 9.2 10*3/uL (ref 4.0–10.5)

## 2016-07-23 LAB — LIPID PANEL
Cholesterol: 269 mg/dL — ABNORMAL HIGH (ref 0–200)
HDL: 52.1 mg/dL (ref >39.00)
NonHDL: 216.76
Total CHOL/HDL Ratio: 5
Triglycerides: 302 mg/dL — ABNORMAL HIGH (ref 0.0–149.0)
VLDL: 60.4 mg/dL — ABNORMAL HIGH (ref 0.0–40.0)

## 2016-07-23 LAB — LDL CHOLESTEROL, DIRECT: Direct LDL: 167 mg/dL

## 2016-07-23 LAB — HEMOGLOBIN A1C: Hgb A1c MFr Bld: 6.1 % (ref 4.6–6.5)

## 2016-07-23 NOTE — Patient Instructions (Signed)
Health Maintenance for Postmenopausal Women Menopause is a normal process in which your reproductive ability comes to an end. This process happens gradually over a span of months to years, usually between the ages of 59 and 38. Menopause is complete when you have missed 12 consecutive menstrual periods. It is important to talk with your health care provider about some of the most common conditions that affect postmenopausal women, such as heart disease, cancer, and bone loss (osteoporosis). Adopting a healthy lifestyle and getting preventive care can help to promote your health and wellness. Those actions can also lower your chances of developing some of these common conditions. What should I know about menopause? During menopause, you may experience a number of symptoms, such as:  Moderate-to-severe hot flashes.  Night sweats.  Decrease in sex drive.  Mood swings.  Headaches.  Tiredness.  Irritability.  Memory problems.  Insomnia. Choosing to treat or not to treat menopausal changes is an individual decision that you make with your health care provider. What should I know about hormone replacement therapy and supplements? Hormone therapy products are effective for treating symptoms that are associated with menopause, such as hot flashes and night sweats. Hormone replacement carries certain risks, especially as you become older. If you are thinking about using estrogen or estrogen with progestin treatments, discuss the benefits and risks with your health care provider. What should I know about heart disease and stroke? Heart disease, heart attack, and stroke become more likely as you age. This may be due, in part, to the hormonal changes that your body experiences during menopause. These can affect how your body processes dietary fats, triglycerides, and cholesterol. Heart attack and stroke are both medical emergencies. There are many things that you can do to help prevent heart disease  and stroke:  Have your blood pressure checked at least every 1-2 years. High blood pressure causes heart disease and increases the risk of stroke.  If you are 59-61 years old, ask your health care provider if you should take aspirin to prevent a heart attack or a stroke.  Do not use any tobacco products, including cigarettes, chewing tobacco, or electronic cigarettes. If you need help quitting, ask your health care provider.  It is important to eat a healthy diet and maintain a healthy weight.  Be sure to include plenty of vegetables, fruits, low-fat dairy products, and lean protein.  Avoid eating foods that are high in solid fats, added sugars, or salt (sodium).  Get regular exercise. This is one of the most important things that you can do for your health.  Try to exercise for at least 150 minutes each week. The type of exercise that you do should increase your heart rate and make you sweat. This is known as moderate-intensity exercise.  Try to do strengthening exercises at least twice each week. Do these in addition to the moderate-intensity exercise.  Know your numbers.Ask your health care provider to check your cholesterol and your blood glucose. Continue to have your blood tested as directed by your health care provider. What should I know about cancer screening? There are several types of cancer. Take the following steps to reduce your risk and to catch any cancer development as early as possible. Breast Cancer  Practice breast self-awareness.  This means understanding how your breasts normally appear and feel.  It also means doing regular breast self-exams. Let your health care provider know about any changes, no matter how small.  If you are 40 or older,  have a clinician do a breast exam (clinical breast exam or CBE) every year. Depending on your age, family history, and medical history, it may be recommended that you also have a yearly breast X-ray (mammogram).  If you  have a family history of breast cancer, talk with your health care provider about genetic screening.  If you are at high risk for breast cancer, talk with your health care provider about having an MRI and a mammogram every year.  Breast cancer (BRCA) gene test is recommended for women who have family members with BRCA-related cancers. Results of the assessment will determine the need for genetic counseling and BRCA1 and for BRCA2 testing. BRCA-related cancers include these types:  Breast. This occurs in males or females.  Ovarian.  Tubal. This may also be called fallopian tube cancer.  Cancer of the abdominal or pelvic lining (peritoneal cancer).  Prostate.  Pancreatic. Cervical, Uterine, and Ovarian Cancer  Your health care provider may recommend that you be screened regularly for cancer of the pelvic organs. These include your ovaries, uterus, and vagina. This screening involves a pelvic exam, which includes checking for microscopic changes to the surface of your cervix (Pap test).  For women ages 21-65, health care providers may recommend a pelvic exam and a Pap test every three years. For women ages 23-65, they may recommend the Pap test and pelvic exam, combined with testing for human papilloma virus (HPV), every five years. Some types of HPV increase your risk of cervical cancer. Testing for HPV may also be done on women of any age who have unclear Pap test results.  Other health care providers may not recommend any screening for nonpregnant women who are considered low risk for pelvic cancer and have no symptoms. Ask your health care provider if a screening pelvic exam is right for you.  If you have had past treatment for cervical cancer or a condition that could lead to cancer, you need Pap tests and screening for cancer for at least 20 years after your treatment. If Pap tests have been discontinued for you, your risk factors (such as having a new sexual partner) need to be reassessed  to determine if you should start having screenings again. Some women have medical problems that increase the chance of getting cervical cancer. In these cases, your health care provider may recommend that you have screening and Pap tests more often.  If you have a family history of uterine cancer or ovarian cancer, talk with your health care provider about genetic screening.  If you have vaginal bleeding after reaching menopause, tell your health care provider.  There are currently no reliable tests available to screen for ovarian cancer. Lung Cancer  Lung cancer screening is recommended for adults 99-83 years old who are at high risk for lung cancer because of a history of smoking. A yearly low-dose CT scan of the lungs is recommended if you:  Currently smoke.  Have a history of at least 30 pack-years of smoking and you currently smoke or have quit within the past 15 years. A pack-year is smoking an average of one pack of cigarettes per day for one year. Yearly screening should:  Continue until it has been 15 years since you quit.  Stop if you develop a health problem that would prevent you from having lung cancer treatment. Colorectal Cancer  This type of cancer can be detected and can often be prevented.  Routine colorectal cancer screening usually begins at age 72 and continues  through age 75.  If you have risk factors for colon cancer, your health care provider may recommend that you be screened at an earlier age.  If you have a family history of colorectal cancer, talk with your health care provider about genetic screening.  Your health care provider may also recommend using home test kits to check for hidden blood in your stool.  A small camera at the end of a tube can be used to examine your colon directly (sigmoidoscopy or colonoscopy). This is done to check for the earliest forms of colorectal cancer.  Direct examination of the colon should be repeated every 5-10 years until  age 75. However, if early forms of precancerous polyps or small growths are found or if you have a family history or genetic risk for colorectal cancer, you may need to be screened more often. Skin Cancer  Check your skin from head to toe regularly.  Monitor any moles. Be sure to tell your health care provider:  About any new moles or changes in moles, especially if there is a change in a mole's shape or color.  If you have a mole that is larger than the size of a pencil eraser.  If any of your family members has a history of skin cancer, especially at a young age, talk with your health care provider about genetic screening.  Always use sunscreen. Apply sunscreen liberally and repeatedly throughout the day.  Whenever you are outside, protect yourself by wearing long sleeves, pants, a wide-brimmed hat, and sunglasses. What should I know about osteoporosis? Osteoporosis is a condition in which bone destruction happens more quickly than new bone creation. After menopause, you may be at an increased risk for osteoporosis. To help prevent osteoporosis or the bone fractures that can happen because of osteoporosis, the following is recommended:  If you are 19-50 years old, get at least 1,000 mg of calcium and at least 600 mg of vitamin D per day.  If you are older than age 50 but younger than age 70, get at least 1,200 mg of calcium and at least 600 mg of vitamin D per day.  If you are older than age 70, get at least 1,200 mg of calcium and at least 800 mg of vitamin D per day. Smoking and excessive alcohol intake increase the risk of osteoporosis. Eat foods that are rich in calcium and vitamin D, and do weight-bearing exercises several times each week as directed by your health care provider. What should I know about how menopause affects my mental health? Depression may occur at any age, but it is more common as you become older. Common symptoms of depression include:  Low or sad  mood.  Changes in sleep patterns.  Changes in appetite or eating patterns.  Feeling an overall lack of motivation or enjoyment of activities that you previously enjoyed.  Frequent crying spells. Talk with your health care provider if you think that you are experiencing depression. What should I know about immunizations? It is important that you get and maintain your immunizations. These include:  Tetanus, diphtheria, and pertussis (Tdap) booster vaccine.  Influenza every year before the flu season begins.  Pneumonia vaccine.  Shingles vaccine. Your health care provider may also recommend other immunizations. This information is not intended to replace advice given to you by your health care provider. Make sure you discuss any questions you have with your health care provider. Document Released: 05/16/2005 Document Revised: 10/12/2015 Document Reviewed: 12/26/2014 Elsevier Interactive Patient   Education  2017 [employer]  

## 2016-07-23 NOTE — Progress Notes (Signed)
Subjective:    Patient ID: Rebekah Myers, female    DOB: [birth year], 61 y.o.   MRN: [id number]  HPI  Pt presents to the clinic today for her annual exam.  Flu: 02/2015 Tetanus: 02/2012 Pap Smear: 2013 Mammogram: 2013 Colon Screening: 08/2014 Vision Screening: as needed Dentist: biannually  Diet: She does eat meat. She consumes fruits and veggies daily. She does eat some fried foods. She drinks mostly water and coffee. Exercise: None  Review of Systems      Past Medical History:  Diagnosis Date  . Arthritis   . Bladder cancer ([place]) 2008  . Bladder cancer ([place])   . Diverticulosis 07/19/2014  . Fibromyalgia 2014  . History of colon polyps   . Hyperlipidemia 06/28/2009   mixed    Current Outpatient Prescriptions  Medication Sig Dispense Refill  . Aspirin-Acetaminophen-Caffeine (EXCEDRIN PO) Take 1 tablet by mouth daily as needed.     No current facility-administered medications for this visit.     Allergies  Allergen Reactions  . Penicillins Swelling    Has patient had a PCN reaction causing immediate rash, facial/tongue/throat swelling, SOB or lightheadedness with hypotension: Yes Has patient had a PCN reaction causing severe rash involving mucus membranes or skin necrosis: No Has patient had a PCN reaction that required hospitalization No Has patient had a PCN reaction occurring within the last 10 years: No If all of the above answers are "NO", then may proceed with Cephalosporin use.   . Sulfa Antibiotics Hives    Family History  Problem Relation Age of Onset  . Depression Mother   . Breast cancer Mother   . Diabetes Father   . Gout Father   . Melanoma Father     Social History   Social History  . Marital status: Married    Spouse name: N/A  . Number of children: N/A  . Years of education: N/A   Occupational History  . Not on file.   Social History Main Topics  . Smoking status: Current Every Day Smoker    Packs/day: 0.25    Years: 30.00   Types: Cigarettes    Last attempt to quit: 04/08/2007  . Smokeless tobacco: Never Used  . Alcohol use No  . Drug use: No  . Sexual activity: Yes    Birth control/ protection: Post-menopausal   Other Topics Concern  . Not on file   Social History Narrative  . No narrative on file     Constitutional: Denies fever, malaise, fatigue, headache or abrupt weight changes.  HEENT: Denies eye pain, eye redness, ear pain, ringing in the ears, wax buildup, runny nose, nasal congestion, bloody nose, or sore throat. Respiratory: Denies difficulty breathing, shortness of breath, cough or sputum production.   Cardiovascular: Denies chest pain, chest tightness, palpitations or swelling in the hands or feet.  Gastrointestinal: Denies abdominal pain, bloating, constipation, diarrhea or blood in the stool.  GU: Denies urgency, frequency, pain with urination, burning sensation, blood in urine, odor or discharge. Musculoskeletal: Denies decrease in range of motion, difficulty with gait, muscle pain or joint pain and swelling.  Skin: Pt reports lump on her neck. Denies redness, rashes, lesions or ulcercations.  Neurological: Denies dizziness, difficulty with memory, difficulty with speech or problems with balance and coordination.  Psych: Denies anxiety, depression, SI/HI.  No other specific complaints in a complete review of systems (except as listed in HPI above).  Objective:   Physical Exam  BP 114/78   Pulse 84  Temp 98.1 F (36.7 C) (Oral)   Ht 5' 6.5" (1.689 m)   Wt 202 lb (91.6 kg)   SpO2 96%   BMI 32.12 kg/m  Wt Readings from Last 3 Encounters:  07/23/16 202 lb (91.6 kg)  05/26/16 198 lb (89.8 kg)  02/05/16 195 lb (88.5 kg)    General: Appears her stated age, obese in NAD. Skin: Warm, dry and intact. 1 cm round subcutaneous cyst noted upper right shoulder blade. HEENT: Head: normal shape and size; Eyes: sclera white, no icterus, conjunctiva pink, PERRLA and EOMs intact; Ears: Tm's  gray and intact, normal light reflex; Throat/Mouth: Teeth present, mucosa pink and moist, no exudate, lesions or ulcerations noted.  Neck:  Neck supple, trachea midline. No masses, lumps or thyromegaly present.  Cardiovascular: Normal rate and rhythm. S1,S2 noted.  No murmur, rubs or gallops noted. No JVD or BLE edema. No carotid bruits noted. Pulmonary/Chest: Normal effort and positive vesicular breath sounds. No respiratory distress. No wheezes, rales or ronchi noted.  Abdomen: Soft and nontender. Normal bowel sounds. No distention or masses noted. Liver, spleen and kidneys non palpable. Pelvic: Normal female anatomy. Cervix without changes. Small amount of thin white discharge noted. Adnexa non palpable. Musculoskeletal: Strength 5/5 BUE/BLE. No difficulty with gait.  Neurological: Alert and oriented. Cranial nerves II-XII grossly intact. Coordination normal.  Psychiatric: Mood and affect normal. Behavior is normal. Judgment and thought content normal.    BMET    Component Value Date/Time   NA 138 02/05/2016 1056   NA 143 07/24/2014   NA 138 07/19/2014 1906   K 3.5 02/05/2016 1056   K 3.6 07/19/2014 1906   CL 104 02/05/2016 1056   CL 105 07/19/2014 1906   CO2 24 02/05/2016 1056   CO2 25 07/19/2014 1906   GLUCOSE 214 (H) 02/05/2016 1056   GLUCOSE 130 (H) 07/19/2014 1906   BUN 16 02/05/2016 1056   BUN 11 07/24/2014   BUN 15 07/19/2014 1906   CREATININE 0.70 02/05/2016 1056   CREATININE 0.61 07/19/2014 1906   CALCIUM 9.2 02/05/2016 1056   CALCIUM 9.0 07/19/2014 1906   GFRNONAA >60 02/05/2016 1056   GFRNONAA >60 07/19/2014 1906   GFRAA >60 02/05/2016 1056   GFRAA >60 07/19/2014 1906    Lipid Panel  No results found for: CHOL, TRIG, HDL, CHOLHDL, VLDL, LDLCALC  CBC    Component Value Date/Time   WBC 11.8 (H) 02/05/2016 1056   RBC 4.64 02/05/2016 1056   HGB 14.0 02/05/2016 1056   HGB 13.8 07/19/2014 1906   HCT 40.2 02/05/2016 1056   HCT 40.5 07/19/2014 1906   PLT 207  02/05/2016 1056   PLT 228 07/19/2014 1906   MCV 86.6 02/05/2016 1056   MCV 86 07/19/2014 1906   MCH 30.2 02/05/2016 1056   MCHC 34.9 02/05/2016 1056   RDW 13.4 02/05/2016 1056   RDW 13.8 07/19/2014 1906   LYMPHSABS 2.3 07/19/2014 1906   MONOABS 0.6 07/19/2014 1906   EOSABS 0.2 07/19/2014 1906   BASOSABS 0.1 07/19/2014 1906    Hgb A1C No results found for: HGBA1C         Assessment & Plan:   Preventative Health Maintenance:  Flu and tetanus UTD Pap smear today, will add STD screening Mammogram ordered, she will [place] to schedule, number provided Colonoscopy UTD Encouraged her to consume a balanced diet and exercise regimen Advised her to see an eye doctor and dentist annually Will check CBC, CMET, Lipid, A1C, HIV and Hep C today  Cyst of Skin:  No intervention needed Will monitor  RTC in 1 year, sooner if needed Webb Silversmith, NP

## 2016-07-24 LAB — HIV ANTIBODY (ROUTINE TESTING W REFLEX): HIV 1&2 Ab, 4th Generation: NONREACTIVE

## 2016-07-24 LAB — HEPATITIS C ANTIBODY: HCV Ab: NEGATIVE

## 2016-07-25 LAB — CYTOLOGY - PAP
Bacterial vaginitis: NEGATIVE
Candida vaginitis: POSITIVE — AB
Chlamydia: NEGATIVE
Diagnosis: NEGATIVE
HPV: NOT DETECTED
Neisseria Gonorrhea: NEGATIVE
Trichomonas: NEGATIVE

## 2016-07-28 ENCOUNTER — Ambulatory Visit
Admission: RE | Admit: 2016-07-28 | Discharge: 2016-07-28 | Disposition: A | Discharge status: Home / Self Care | Payer: BLUE CROSS/BLUE SHIELD | Source: Ambulatory Visit | Attending: Internal Medicine | Admitting: Internal Medicine

## 2016-07-28 DIAGNOSIS — Z1231 Encounter for screening mammogram for malignant neoplasm of breast: Secondary | ICD-10-CM | POA: Insufficient documentation

## 2016-07-28 DIAGNOSIS — Z0000 Encounter for general adult medical examination without abnormal findings: Secondary | ICD-10-CM

## 2016-07-29 ENCOUNTER — Other Ambulatory Visit: Payer: Self-pay | Admitting: Internal Medicine

## 2016-07-29 MED ORDER — FLUCONAZOLE 150 MG PO TABS: 150.0000 mg | ORAL_TABLET | Freq: Once | ORAL | 0 refills | Status: AC

## 2016-07-29 NOTE — Addendum Note (Signed)
Addended by: Lurlean Nanny on: 07/29/2016 10:08 AM   Modules accepted: Orders

## 2016-10-27 ENCOUNTER — Other Ambulatory Visit (INDEPENDENT_AMBULATORY_CARE_PROVIDER_SITE_OTHER): Payer: BLUE CROSS/BLUE SHIELD

## 2016-10-27 DIAGNOSIS — E782 Mixed hyperlipidemia: Secondary | ICD-10-CM

## 2016-10-27 LAB — LIPID PANEL
Cholesterol: 206 mg/dL — ABNORMAL HIGH (ref 0–200)
HDL: 41 mg/dL (ref >39.00)
NonHDL: 165.35
Total CHOL/HDL Ratio: 5
Triglycerides: 256 mg/dL — ABNORMAL HIGH (ref 0.0–149.0)
VLDL: 51.2 mg/dL — ABNORMAL HIGH (ref 0.0–40.0)

## 2016-10-27 LAB — LDL CHOLESTEROL, DIRECT: Direct LDL: 129 mg/dL

## 2016-10-31 ENCOUNTER — Other Ambulatory Visit: Payer: Self-pay | Admitting: Internal Medicine

## 2016-10-31 DIAGNOSIS — E782 Mixed hyperlipidemia: Secondary | ICD-10-CM

## 2016-10-31 DIAGNOSIS — R7309 Other abnormal glucose: Secondary | ICD-10-CM

## 2016-10-31 MED ORDER — FENOFIBRATE 48 MG PO TABS: 48.0000 mg | ORAL_TABLET | Freq: Every day | ORAL | 2 refills | Status: DC

## 2017-01-09 DIAGNOSIS — R05 Cough: Secondary | ICD-10-CM | POA: Diagnosis not present

## 2017-01-09 DIAGNOSIS — J209 Acute bronchitis, unspecified: Secondary | ICD-10-CM | POA: Diagnosis not present

## 2017-01-28 ENCOUNTER — Other Ambulatory Visit: Payer: BLUE CROSS/BLUE SHIELD

## 2017-02-07 DIAGNOSIS — J209 Acute bronchitis, unspecified: Secondary | ICD-10-CM | POA: Diagnosis not present

## 2017-02-07 DIAGNOSIS — J019 Acute sinusitis, unspecified: Secondary | ICD-10-CM | POA: Diagnosis not present

## 2017-05-13 DIAGNOSIS — R509 Fever, unspecified: Secondary | ICD-10-CM | POA: Diagnosis not present

## 2017-05-13 DIAGNOSIS — J40 Bronchitis, not specified as acute or chronic: Secondary | ICD-10-CM | POA: Diagnosis not present

## 2017-05-13 DIAGNOSIS — J9801 Acute bronchospasm: Secondary | ICD-10-CM | POA: Diagnosis not present

## 2017-05-13 DIAGNOSIS — R05 Cough: Secondary | ICD-10-CM | POA: Diagnosis not present

## 2017-05-13 DIAGNOSIS — R112 Nausea with vomiting, unspecified: Secondary | ICD-10-CM | POA: Diagnosis not present

## 2017-05-13 DIAGNOSIS — J101 Influenza due to other identified influenza virus with other respiratory manifestations: Secondary | ICD-10-CM | POA: Diagnosis not present

## 2017-05-13 DIAGNOSIS — R6889 Other general symptoms and signs: Secondary | ICD-10-CM | POA: Diagnosis not present

## 2017-05-13 DIAGNOSIS — R197 Diarrhea, unspecified: Secondary | ICD-10-CM | POA: Diagnosis not present

## 2017-12-28 DIAGNOSIS — J209 Acute bronchitis, unspecified: Secondary | ICD-10-CM | POA: Diagnosis not present

## 2017-12-28 DIAGNOSIS — M791 Myalgia, unspecified site: Secondary | ICD-10-CM | POA: Diagnosis not present

## 2017-12-30 DIAGNOSIS — J209 Acute bronchitis, unspecified: Secondary | ICD-10-CM | POA: Diagnosis not present

## 2017-12-30 DIAGNOSIS — J069 Acute upper respiratory infection, unspecified: Secondary | ICD-10-CM | POA: Diagnosis not present

## 2018-01-06 DIAGNOSIS — S90211A Contusion of right great toe with damage to nail, initial encounter: Secondary | ICD-10-CM | POA: Diagnosis not present

## 2018-01-06 DIAGNOSIS — M79671 Pain in right foot: Secondary | ICD-10-CM | POA: Diagnosis not present

## 2018-01-12 DIAGNOSIS — S90211A Contusion of right great toe with damage to nail, initial encounter: Secondary | ICD-10-CM | POA: Diagnosis not present

## 2018-01-12 DIAGNOSIS — M79671 Pain in right foot: Secondary | ICD-10-CM | POA: Diagnosis not present

## 2018-01-22 ENCOUNTER — Ambulatory Visit (INDEPENDENT_AMBULATORY_CARE_PROVIDER_SITE_OTHER): Payer: BLUE CROSS/BLUE SHIELD | Admitting: Family Medicine

## 2018-01-22 ENCOUNTER — Encounter: Payer: Self-pay | Admitting: Family Medicine

## 2018-01-22 VITALS — BP 118/78 | HR 72 | Temp 98.0°F | Ht 65.5 in | Wt 210.2 lb

## 2018-01-22 DIAGNOSIS — E782 Mixed hyperlipidemia: Secondary | ICD-10-CM

## 2018-01-22 DIAGNOSIS — Z8551 Personal history of malignant neoplasm of bladder: Secondary | ICD-10-CM

## 2018-01-22 DIAGNOSIS — Z0000 Encounter for general adult medical examination without abnormal findings: Secondary | ICD-10-CM

## 2018-01-22 DIAGNOSIS — E669 Obesity, unspecified: Secondary | ICD-10-CM

## 2018-01-22 DIAGNOSIS — Z23 Encounter for immunization: Secondary | ICD-10-CM | POA: Diagnosis not present

## 2018-01-22 DIAGNOSIS — Z1239 Encounter for other screening for malignant neoplasm of breast: Secondary | ICD-10-CM

## 2018-01-22 DIAGNOSIS — L821 Other seborrheic keratosis: Secondary | ICD-10-CM

## 2018-01-22 LAB — CBC WITH DIFFERENTIAL/PLATELET
Basophils Absolute: 39 cells/uL (ref 0–200)
Basophils Relative: 0.7 %
Eosinophils Absolute: 182 cells/uL (ref 15–500)
Eosinophils Relative: 3.3 %
HCT: 40.4 % (ref 35.0–45.0)
Hemoglobin: 13.4 g/dL (ref 11.7–15.5)
Lymphs Abs: 2151 cells/uL (ref 850–3900)
MCH: 28.2 pg (ref 27.0–33.0)
MCHC: 33.2 g/dL (ref 32.0–36.0)
MCV: 85.1 fL (ref 80.0–100.0)
MPV: 10.7 fL (ref 7.5–12.5)
Monocytes Relative: 7.5 %
Neutro Abs: 2717 cells/uL (ref 1500–7800)
Neutrophils Relative %: 49.4 %
Platelets: 249 10*3/uL (ref 140–400)
RBC: 4.75 10*6/uL (ref 3.80–5.10)
RDW: 13.2 % (ref 11.0–15.0)
Total Lymphocyte: 39.1 %
WBC mixed population: 413 cells/uL (ref 200–950)
WBC: 5.5 10*3/uL (ref 3.8–10.8)

## 2018-01-22 LAB — COMPLETE METABOLIC PANEL WITH GFR
AG Ratio: 1.6 (calc) (ref 1.0–2.5)
ALT: 26 U/L (ref 6–29)
AST: 21 U/L (ref 10–35)
Albumin: 4.2 g/dL (ref 3.6–5.1)
Alkaline phosphatase (APISO): 94 U/L (ref 33–130)
BUN: 13 mg/dL (ref 7–25)
CO2: 26 mmol/L (ref 20–32)
Calcium: 9.2 mg/dL (ref 8.6–10.4)
Chloride: 106 mmol/L (ref 98–110)
Creat: 0.67 mg/dL (ref 0.50–0.99)
GFR, Est African American: 111 mL/min/{1.73_m2} (ref >=60)
GFR, Est Non African American: 96 mL/min/{1.73_m2} (ref >=60)
Globulin: 2.7 g/dL (calc) (ref 1.9–3.7)
Glucose, Bld: 76 mg/dL (ref 65–99)
Potassium: 4 mmol/L (ref 3.5–5.3)
Sodium: 143 mmol/L (ref 135–146)
Total Bilirubin: 0.7 mg/dL (ref 0.2–1.2)
Total Protein: 6.9 g/dL (ref 6.1–8.1)

## 2018-01-22 LAB — TSH: TSH: 0.78 mIU/L (ref 0.40–4.50)

## 2018-01-22 LAB — LIPID PANEL
Cholesterol: 203 mg/dL — ABNORMAL HIGH (ref <200)
HDL: 45 mg/dL — ABNORMAL LOW (ref >50)
LDL Cholesterol (Calc): 122 mg/dL (calc) — ABNORMAL HIGH
Non-HDL Cholesterol (Calc): 158 mg/dL (calc) — ABNORMAL HIGH (ref <130)
Total CHOL/HDL Ratio: 4.5 (calc) (ref <5.0)
Triglycerides: 255 mg/dL — ABNORMAL HIGH (ref <150)

## 2018-01-22 NOTE — Progress Notes (Signed)
BP 118/78   Pulse 72   Temp 98 F (36.7 C) (Oral)   Ht 5' 5.5" (1.664 m)   Wt 210 lb 3.2 oz (95.3 kg)   SpO2 98%   BMI 34.45 kg/m    Subjective:    Patient ID: Rebekah Myers, female    DOB: [birth year], 62 y.o.   MRN: [id number]  HPI: Rebekah Myers is a 62 y.o. female  Chief Complaint  Patient presents with  . New Patient (Initial Visit)    HPI Patient is here to establish care  She needs labs and a physical  She says her labs are going to be "horrible" High cholesterol She has gained so much weight She quit smoking, 11 months next week; coped with quitting smoking by eating Used to exercise; used to work out with a trainer; ready to get back Close to 30 pounds; not just stopping smoking; moved, husband broke his leg, father-in-law, selling business No thyroid disease in the family Whole lots of problems with sleeping She knows what to do, what to eat, how to exercise; she has called her old trainer and he is going to start back Family hx of diabetes; father and grandmother; no dry mouth; foot infectin that healed quickly; urinates at night but had the bladder cancer, nothing new No soft drinks; drinks hot tea with a little sugar and honey Colonoscopy in 03/14/2015; next due in five years She had bronchitis recently; all cleared up Infection on right toe; saw Dr. Sharlet [place]; he removed the whole toenail that day Skin has become dry recently; no hair loss; no constipation  Depression screen [facility] 2/9 01/22/2018 05/26/2016  Decreased Interest 0 0  Down, Depressed, Hopeless 0 0  PHQ - 2 Score 0 0  Altered sleeping 0 -  Tired, decreased energy 0 -  Change in appetite 0 -  Feeling bad or failure about yourself  0 -  Trouble concentrating 0 -  Moving slowly or fidgety/restless 0 -  Suicidal thoughts 0 -  PHQ-9 Score 0 -  Difficult doing work/chores Not difficult at all -   Fall Risk  01/22/2018 05/26/2016  Falls in the past year? No No    Relevant past medical, surgical,  family and social history reviewed Past Medical History:  Diagnosis Date  . Arthritis   . Bladder cancer ([place]) 2008   surgery and 6 weeks of chemo tx  . Diverticulosis 07/19/2014  . Fibromyalgia 2014  . History of colon polyps   . Hyperlipidemia 06/28/2009   mixed   Past Surgical History:  Procedure Laterality Date  . [place] cancer  (2007) 2008   as stated [place]Urology in [facility]. BCG treatments weekly x21 followed every year.  Marland Kitchen DILATATION & CURETTAGE/HYSTEROSCOPY WITH MYOSURE N/A 12/04/2014   Procedure: DILATATION & CURETTAGE/HYSTEROSCOPY WITH MYOSURE;  Surgeon: Boykin Nearing, MD;  Location: [facility] ORS;  Service: Gynecology;  Laterality: N/A;  . Right Toe fracture Right 2009   Right First Toe surgery secondary to fracture   Family History  Problem Relation Age of Onset  . Depression Mother   . Breast cancer Mother 26  . Diabetes Father   . Gout Father   . Melanoma Father   . Hyperlipidemia Sister   . Hypertension Sister   . Aneurysm Maternal Grandmother   . Breast cancer Paternal Grandmother   . Heart attack Paternal Grandfather    Social History   Tobacco Use  . Smoking status: Former Smoker    Packs/day: 0.25  Years: 30.00    Pack years: 7.50    Types: Cigarettes    Last attempt to quit: 02/05/2017    Years since quitting: 0.9  . Smokeless tobacco: Never Used  Substance Use Topics  . Alcohol use: No  . Drug use: No     Office Visit from 01/22/2018 in [facility]  AUDIT-C Score  0      Interim medical history since last visit reviewed. Allergies and medications reviewed  Review of Systems  Constitutional: Positive for unexpected weight change.  Respiratory: Negative for wheezing.   Cardiovascular: Negative for chest pain and leg swelling.   Per HPI unless specifically indicated above     Objective:    BP 118/78   Pulse 72   Temp 98 F (36.7 C) (Oral)   Ht 5' 5.5" (1.664 m)   Wt 210 lb 3.2 oz (95.3 kg)   SpO2  98%   BMI 34.45 kg/m   Wt Readings from Last 3 Encounters:  01/22/18 210 lb 3.2 oz (95.3 kg)  07/23/16 202 lb (91.6 kg)  05/26/16 198 lb (89.8 kg)    Physical Exam  Constitutional: She appears well-developed and well-nourished. No distress.  HENT:  Head: Normocephalic and atraumatic.  Eyes: EOM are normal. No scleral icterus.  Neck: No thyromegaly present.  Cardiovascular: Normal rate, regular rhythm and normal heart sounds.  No murmur heard. Pulmonary/Chest: Effort normal and breath sounds normal. No respiratory distress. She has no wheezes.  Abdominal: Soft. Bowel sounds are normal. She exhibits no distension.  Musculoskeletal: She exhibits no edema.  Neurological: She is alert.  Skin: Skin is warm and dry. She is not diaphoretic. No pallor.  Left side scalp light brown keratotic lesion, well defined borders  Psychiatric: She has a normal mood and affect. Her behavior is normal. Judgment and thought content normal.        Assessment & Plan:   Problem List Items Addressed This Visit      Other   Obesity (BMI 30.0-34.9)    See AVS      Hx of bladder cancer    Seeing urologist yearly in [facility]; likely from smoking; no blood in the urine      HLD (hyperlipidemia) - Primary    Check lipids today, fasting today       Other Visit Diagnoses    Need for influenza vaccination       Relevant Orders   Flu Vaccine QUAD 6+ mos PF IM (Fluarix Quad PF) (Completed)   Screening for breast cancer       Relevant Orders   MM 3D SCREEN BREAST BILATERAL   Preventative health care       return for actual CPE; labs today   Relevant Orders   CBC with Differential/Platelet (Completed)   COMPLETE METABOLIC PANEL WITH GFR (Completed)   TSH (Completed)   Lipid panel (Completed)   Seborrheic keratosis       on scalp; benign       Follow up plan: Return in about 3 weeks (around 02/12/2018) for complete physical or first available.  An after-visit summary was printed and given  to the patient at [place].  Please see the patient instructions which may contain other information and recommendations beyond what is mentioned above in the assessment and plan.  No orders of the defined types were placed in this encounter.   Orders Placed This Encounter  Procedures  . MM 3D SCREEN BREAST BILATERAL  . Flu Vaccine  QUAD 6+ mos PF IM (Fluarix Quad PF)  . CBC with Differential/Platelet  . COMPLETE METABOLIC PANEL WITH GFR  . TSH  . Lipid panel

## 2018-01-22 NOTE — Patient Instructions (Addendum)
Check out the information at familydoctor.org entitled "Nutrition for Weight Loss: What You Need to Know about Fad Diets" Try to lose between 1-2 pounds per week by taking in fewer calories and burning off more calories You can succeed by limiting portions, limiting foods dense in calories and fat, becoming more active, and drinking 8 glasses of water a day (64 ounces) Don't skip meals, especially breakfast, as skipping meals may alter your metabolism Do not use over-the-counter weight loss pills or gimmicks that claim rapid weight loss A healthy BMI (or body mass index) is between 18.5 and 24.9 You can calculate your ideal BMI at the NIH website ClubMonetize.fr  Try to limit saturated fats in your diet (bologna, hot dogs, barbeque, cheeseburgers, hamburgers, steak, bacon, sausage, cheese, etc.) and get more fresh fruits, vegetables, and whole grains  If you have not heard anything from my staff in a week about any orders/referrals/studies from today, please contact us here to follow-up [phone]

## 2018-01-22 NOTE — Assessment & Plan Note (Signed)
Seeing urologist yearly in [facility]; likely from smoking; no blood in the urine

## 2018-01-22 NOTE — Assessment & Plan Note (Signed)
Check lipids today, fasting today

## 2018-01-22 NOTE — Assessment & Plan Note (Signed)
See AVS

## 2018-02-17 ENCOUNTER — Encounter: Payer: BLUE CROSS/BLUE SHIELD | Admitting: Family Medicine

## 2018-03-11 ENCOUNTER — Encounter: Payer: BLUE CROSS/BLUE SHIELD | Admitting: Family Medicine

## 2018-11-23 ENCOUNTER — Other Ambulatory Visit: Payer: Self-pay

## 2018-11-23 DIAGNOSIS — Z20822 Contact with and (suspected) exposure to covid-19: Secondary | ICD-10-CM

## 2018-11-24 LAB — NOVEL CORONAVIRUS, NAA: SARS-CoV-2, NAA: NOT DETECTED

## 2018-12-27 ENCOUNTER — Other Ambulatory Visit: Payer: Self-pay

## 2018-12-27 DIAGNOSIS — Z20822 Contact with and (suspected) exposure to covid-19: Secondary | ICD-10-CM

## 2018-12-29 ENCOUNTER — Telehealth: Payer: Self-pay | Admitting: General Practice

## 2018-12-29 LAB — NOVEL CORONAVIRUS, NAA: SARS-CoV-2, NAA: NOT DETECTED

## 2018-12-29 NOTE — Telephone Encounter (Signed)
Negative COVID results given. Patient results "NOT Detected." Caller expressed understanding. ° °

## 2019-03-09 ENCOUNTER — Other Ambulatory Visit: Payer: Self-pay

## 2019-03-09 DIAGNOSIS — Z20822 Contact with and (suspected) exposure to covid-19: Secondary | ICD-10-CM

## 2019-03-12 LAB — NOVEL CORONAVIRUS, NAA: SARS-CoV-2, NAA: NOT DETECTED

## 2019-05-04 ENCOUNTER — Ambulatory Visit: Payer: No Typology Code available for payment source | Attending: Internal Medicine

## 2019-05-04 DIAGNOSIS — Z20822 Contact with and (suspected) exposure to covid-19: Secondary | ICD-10-CM

## 2019-05-05 LAB — NOVEL CORONAVIRUS, NAA: SARS-CoV-2, NAA: NOT DETECTED

## 2019-06-24 ENCOUNTER — Ambulatory Visit
Admission: RE | Admit: 2019-06-24 | Discharge: 2019-06-24 | Disposition: A | Discharge status: Home / Self Care | Payer: 59 | Source: Ambulatory Visit | Attending: Internal Medicine | Admitting: Internal Medicine

## 2019-06-24 ENCOUNTER — Encounter: Payer: Self-pay | Admitting: Internal Medicine

## 2019-06-24 ENCOUNTER — Other Ambulatory Visit: Payer: Self-pay

## 2019-06-24 ENCOUNTER — Ambulatory Visit: Payer: 59 | Admitting: Internal Medicine

## 2019-06-24 VITALS — BP 128/84 | HR 87 | Temp 97.3°F | Resp 16 | Ht 66.0 in | Wt 211.2 lb

## 2019-06-24 DIAGNOSIS — Z8551 Personal history of malignant neoplasm of bladder: Secondary | ICD-10-CM | POA: Diagnosis not present

## 2019-06-24 DIAGNOSIS — E6609 Other obesity due to excess calories: Secondary | ICD-10-CM

## 2019-06-24 DIAGNOSIS — M549 Dorsalgia, unspecified: Secondary | ICD-10-CM | POA: Diagnosis not present

## 2019-06-24 DIAGNOSIS — M62830 Muscle spasm of back: Secondary | ICD-10-CM

## 2019-06-24 DIAGNOSIS — Z6834 Body mass index (BMI) 34.0-34.9, adult: Secondary | ICD-10-CM

## 2019-06-24 MED ORDER — CYCLOBENZAPRINE HCL 10 MG PO TABS: 10.0000 mg | ORAL_TABLET | Freq: Three times a day (TID) | ORAL | 1 refills | Status: DC | PRN

## 2019-06-24 NOTE — Patient Instructions (Signed)
Please obtain CXR today.

## 2019-06-24 NOTE — Progress Notes (Addendum)
Patient ID: Rebekah Myers, female    DOB: [birth year], 64 y.o.   MRN: [id number]  PCP: Towanda Malkin, MD  Chief Complaint  Patient presents with  . Back Pain    onset 1 week. Hard to get up from sitting or lying, worst pain is getting in the bed. Feels she isn't getting enough air.    Subjective:   Rebekah Myers is a 64 y.o. female, presents to clinic with CC of the following:  Chief Complaint  Patient presents with  . Back Pain    onset 1 week. Hard to get up from sitting or lying, worst pain is getting in the bed. Feels she isn't getting enough air.    HPI:  Patient is a 64 year old female who first presented in October 2019 to establish herself with this practice, and has not returned since. The labs at that time showed an abnormal cholesterol panel, with no medications recommended, and are otherwise okay. She also has a history of bladder cancer, seeing a urologist in [facility] yearly was noted and good in recent past, and mild obesity. She has had 4 Covid tests in August, September, December 2020, and January 2021 - all negative  She follows up today with complaints of upper back pain.  Patient notes the increasing back pain present for 7 days Notes pain a 10 /10 on the pain scale at its worst, and very episodic, was 1-2 times/day, now increased to 10-15 times/ day.  Occurs in an attack like onset after certain movements. Pain felt in upper back, more on right side, feels on both sides, in the area at the level of the bra strap across the back.  (Below the scapula bilaterally.) Notes difficulty getting up from a sitting or lying down position and getting into bed and needs to let the air out very slowly or otherwise very painful + Trouble sleeping, but not think increased due to the pain and sleeps on her side No acute trauma prior to onset  No pain at rest and only occurs with movements No pain radiates down the arms, or up to neck/jaw No numbness, tingling or  marked weakness in UE's, no dropping things No fevers or other infectious symptoms of concern No CP, feels pressure over chest, no SOB , and notes a feeling at times as if she is struggling to get enough air, actuallyfeels good to take a deep breath and no pain with deep breaths Has tried aleve, and hasn't tried heat or ice topically to help, tried a product from [employer] that pulsates and heat and made worse  Has no h/o  back pain in the past,  No h/o major prior back injury/fracture/surgery, nothing like this before + cancer history -bladder No history of blood clots.  No calf pains or increased redness or swelling in the legs. No diabetes history Tob - former smoker, quit over 2 years ago  She noted both parents died from cancer, no one had early heart disease in her family  Patient Active Problem List   Diagnosis Date Noted  . Hx of bladder cancer 01/22/2018  . Obesity (BMI 30.0-34.9) 01/22/2018  . Fibromyalgia 05/26/2016  . HLD (hyperlipidemia) 05/26/2016  . DD (diverticular disease) 10/23/2014     No current outpatient medications on file.   Allergies  Allergen Reactions  . Penicillins Swelling    Has patient had a PCN reaction causing immediate rash, facial/tongue/throat swelling, SOB or lightheadedness with hypotension: Yes Has patient  had a PCN reaction causing severe rash involving mucus membranes or skin necrosis: No Has patient had a PCN reaction that required hospitalization No Has patient had a PCN reaction occurring within the last 10 years: No If all of the above answers are "NO", then may proceed with Cephalosporin use.   . Sulfa Antibiotics Hives     Past Surgical History:  Procedure Laterality Date  . Bledder cancer  (2007) 2008   as stated [facility]/Urology in [facility]. BCG treatments weekly x21 followed every year.  Marland Kitchen DILATATION & CURETTAGE/HYSTEROSCOPY WITH MYOSURE N/A 12/04/2014   Procedure: DILATATION & CURETTAGE/HYSTEROSCOPY WITH MYOSURE;  Surgeon:  Boykin Nearing, MD;  Location: [facility] ORS;  Service: Gynecology;  Laterality: N/A;  . Right Toe fracture Right 2009   Right First Toe surgery secondary to fracture     Family History  Problem Relation Age of Onset  . Depression Mother   . Breast cancer Mother 26  . Diabetes Father   . Gout Father   . Melanoma Father   . Hyperlipidemia Sister   . Hypertension Sister   . Aneurysm Maternal Grandmother   . Breast cancer Paternal Grandmother   . Heart attack Paternal Grandfather      Social History   Tobacco Use  . Smoking status: Former Smoker    Packs/day: 0.25    Years: 30.00    Pack years: 7.50    Types: Cigarettes    Quit date: 02/05/2017    Years since quitting: 2.3  . Smokeless tobacco: Never Used  Substance Use Topics  . Alcohol use: No    With staff assistance, above reviewed with the patient today.  ROS: As per HPI, no recent dysuria or urinary complaints of concern, no blood in the urine, and she notes with her bladder cancer history she very much pays attention to this.  No abdominal pains.  Otherwise no specific complaints on a limited and focused system review   No results found for this or any previous visit (from the past 72 hour(s)).   PHQ2/9: Depression screen [facility] 2/9 06/24/2019 01/22/2018 05/26/2016  Decreased Interest 0 0 0  Down, Depressed, Hopeless 0 0 0  PHQ - 2 Score 0 0 0  Altered sleeping 3 0 -  Tired, decreased energy 2 0 -  Change in appetite 1 0 -  Feeling bad or failure about yourself  0 0 -  Trouble concentrating 0 0 -  Moving slowly or fidgety/restless 0 0 -  Suicidal thoughts 0 0 -  PHQ-9 Score 6 0 -  Difficult doing work/chores Not difficult at all Not difficult at all -   PHQ-2/9 Result reviewed  Fall Risk: Fall Risk  06/24/2019 01/22/2018 05/26/2016  Falls in the past year? 0 No No  Number falls in past yr: 0 - -  Injury with Fall? 0 - -      Objective:   Vitals:   06/24/19 1354  BP: 128/84  Pulse: 87  Resp: 16    Temp: (!) 97.3 F (36.3 C)  TempSrc: Temporal  SpO2: 96%  Weight: 211 lb 3.2 oz (95.8 kg)  Height: 5\' 6"  (1.676 m)    Body mass index is 34.09 kg/m.  Physical Exam   NAD, masked, pleasant HEENT - [place]/AT, sclera anicteric, PERRL, EOMI, conj - non-inj'ed, pharynx clear Neck - supple, no adenopathy, carotids 2+ and = without bruits bilat Car - RRR without m/g/r, not tachy Pulm- RR and effort normal at rest, CTA without wheeze or  rales, no pain with deep inspiration Abd - soft, obese, NT, ND,  no masses When trying to lay flat to examine the abdomen, she noted the marked pain across the low back and looked very uncomfortable.  Also painful getting back up from the lying flat position in the back region Back - no CVA tenderness, nontender with palpation over the spine.  Nontender palpating the infrascapular regions, and nontender to palpation across the mid back where the bra strap was and slightly above where she feels her discomfort when it occurs.  No bruising or swelling in this area.  Nontender in the low back including over the lumbar spine. Ext - no LE edema, No tenderness with palpating the calfs bilateral, no increased erythema or warmth, no cords palpated  no radicular signs down the upper extremity, with good grip, and sensation grossly intact to light touch in the distal upper extremities. Neuro/psychiatric - affect was not flat, appropriate with conversation  Alert and oriented  Speech and gait are normal   EKG obtained-sinus rhythm with a ventricular rate of 75 (not tachycardic), compared to an EKG from 02/05/2016, no significant change (a deeper Q in lead III noted, without concerns in the other inferior leads noted), nonspecific T wave changes anterior, no concerning acute change.   Results for orders placed or performed in visit on 05/04/19  Novel Coronavirus, NAA ([facility])   Specimen: Nasopharyngeal(NP) swabs in vial transport medium   NASOPHARYNGE  TESTING  Result  Value Ref Range   SARS-CoV-2, NAA Not Detected Not Detected       Assessment & Plan:   1. Upper/Mid back pain The exact source of this is unclear.  Discussed this with the patient.  Does not seem like a cardiac event or cardiac source, with no pain with exertion or relieved with rest component, no concerning associated symptoms with no radiation to the jaw or down the arm, although given she notes some intermittent chest pressure symptoms, will check an ECG.  Doubt a spinal source, or fracture concern with no trauma noted prior, no pain over the spine.  Doubt a PE, with no tachycardia, and with the modified Wells criteria reviewed, her one-point is from her cancer history.  Still has low probability based on that.  No history of DVT or PE.  She looks quite comfortable at rest, with her symptoms very episodic.  Question if could be a thoracic back muscle spasm, but am concerned with her symptoms so severe when they occur.  We will get a chest x-ray.  Doubt a pneumothorax, although do want to exclude.  Question if could have a component of a diaphragmatic spasm as well.  If the EKG and chest x-ray are okay, will try a muscle relaxer, Flexeril-can use up to 3 times a day as needed, but warned may make drowsy, and take mainly at bedtime. Not anxious to add chronic anti-inflammatories, and she notes the Aleve product really did not help. Emphasized at great length to her today, the importance of proceeding to an emergency room for further evaluation/management if symptoms are increasing, she is getting short of breath, any persistent chest pains, sweatiness, pains that go up to the jaw or down the arm, or just if her symptoms are increasing, or other more concerning symptoms develop in association as do feel further work-up is needed more acutely.  She was very understanding of that recommendation today. - DG Chest 2 View; Future -ECG 2. Spasm of thoracic back muscle As above  3. Hx of bladder cancer As  above, noted can be more thrombogenic in the setting of cancer, although she noted she has been good on recent checks, and she follows up yearly with her doctor in [facility].   4. Class 1 obesity due to excess calories without serious comorbidity with body mass index (BMI) of 34.0 to 34.9 in adult   Addendum: The chest x-ray was read as no acute abnormality.  No concerns were identified on the chest x-ray.  The patient was called and notified of that result.    Towanda Malkin, MD 06/24/19 2:00 PM

## 2019-06-29 ENCOUNTER — Telehealth: Payer: Self-pay

## 2019-06-29 NOTE — Telephone Encounter (Signed)
Reached out to patient, she states her back is the same, no change. She would rate it a zero most of the time. When she has a spasm the pain goes to a 9 or 10 but only last about 10 seconds. The pressure on the chest is more occasional not constant like before. Patient overall feels she is improving.

## 2019-07-08 ENCOUNTER — Ambulatory Visit: Payer: 59 | Attending: Internal Medicine

## 2019-07-08 ENCOUNTER — Other Ambulatory Visit: Payer: Self-pay

## 2019-07-08 DIAGNOSIS — Z23 Encounter for immunization: Secondary | ICD-10-CM

## 2019-07-08 NOTE — Progress Notes (Signed)
   Covid-19 Vaccination Clinic  Name:  Brystal Edholm Down    MRN: [id number] DOB: Aug 10, 1955  07/08/2019  Ms. Senegal was observed post Covid-19 immunization for 15 minutes without incident. She was provided with Vaccine Information Sheet and instruction to access the V-Safe system.   Ms. Nogales was instructed to [place] 911 with any severe reactions post vaccine: Marland Kitchen Difficulty breathing  . Swelling of face and throat  . A fast heartbeat  . A bad rash all over body  . Dizziness and weakness   Immunizations Administered    Name Date Dose VIS Date Route   Pfizer COVID-19 Vaccine 07/08/2019  8:15 AM 0.3 mL 03/18/2019 Intramuscular   Manufacturer: [place]   Lot: [id number]   [place]: [id number]

## 2019-08-03 ENCOUNTER — Ambulatory Visit: Payer: 59 | Attending: Internal Medicine

## 2019-08-03 DIAGNOSIS — Z23 Encounter for immunization: Secondary | ICD-10-CM

## 2019-08-03 NOTE — Progress Notes (Signed)
   Covid-19 Vaccination Clinic  Name:  Rebekah Myers    MRN: [id number] DOB: [birth year]  08/03/2019  [name] was observed post Covid-19 immunization for 15 minutes without incident. She was provided with Vaccine Information Sheet and instruction to access the V-Safe system.   [name] was instructed to [place] 911 with any severe reactions post vaccine: Marland Kitchen Difficulty breathing  . Swelling of face and throat  . A fast heartbeat  . A bad rash all over body  . Dizziness and weakness   Immunizations Administered    Name Date Dose VIS Date Route   Pfizer COVID-19 Vaccine 08/03/2019  3:22 PM 0.3 mL 06/01/2018 Intramuscular   Manufacturer: [place]   Lot: [id number]   [place]: [id number]

## 2019-12-14 ENCOUNTER — Telehealth: Payer: Self-pay

## 2019-12-14 NOTE — Telephone Encounter (Signed)
Sent to wrong office. Not a patient of [facility].

## 2019-12-14 NOTE — Telephone Encounter (Signed)
Copied from [place] [phone] [id number]. Topic: Quick Communication - See Telephone Encounter >> Dec 14, 2019 12:51 PM Loma [place] wrote: CRM for notification. See Telephone encounter for: 12/14/19. [phone] CB pt need for insurance purposes a Biometric blood test by Friday. States has never heard of it and does not know what it is. Wants an explanation

## 2019-12-20 NOTE — Telephone Encounter (Signed)
Pt calling regarding this request. Please advise.

## 2019-12-26 NOTE — Progress Notes (Signed)
Patient ID: Rebekah Myers, female    DOB: 01/13/1956, 64 y.o.   MRN: [id number]  PCP: Towanda Malkin, MD  Chief Complaint  Patient presents with  . Lab work    patient needs wellness labwork done for work    Subjective:   Rebekah Myers is a 64 y.o. female, presents to clinic with CC of the following:  Chief Complaint  Patient presents with  . Lab work    patient needs wellness labwork done for work    HPI:  Patient is a 64 year old female Last visit with me was in March for back pain. Her last visit prior to that was in October 2019 with that note reviewed. She has a history of obesity, bladder cancer, hyperlipidemia noted. Presents today for a biometric screening assessment, she did not have specifics of what was needed, just stated it is a lab test done through [place], and is sent to her company by [employer] and needed for her insurance and cost issues related She had no specific complaints today, notes has been feeling well.  Hyperlipidemia Medication regimen - none  Lab Results  Component Value Date   CHOL 203 (H) 01/22/2018   HDL 45 (L) 01/22/2018   LDLCALC 122 (H) 01/22/2018   LDLDIRECT 129.0 10/27/2016   TRIG 255 (H) 01/22/2018   CHOLHDL 4.5 01/22/2018   Obesity Wt Readings from Last 3 Encounters:  12/27/19 201 lb 9.6 oz (91.4 kg)  06/24/19 211 lb 3.2 oz (95.8 kg)  01/22/18 210 lb 3.2 oz (95.3 kg)    Diet-tries to eat a healthy diet, weight has decreased about 10 pounds since March  History of tobacco use-quit about 3 years ago (02/2017)  Family hx of diabetes; father and grandmother; Lab Results  Component Value Date   HGBA1C 6.1 07/23/2016   Lab Results  Component Value Date   LDLCALC 122 (H) 01/22/2018   CREATININE 0.67 01/22/2018    Colon cancer screen Colonoscopy in 03/14/2015; next due in five years, which will be this December, and reviewed this with her today.  History of bladder cancer Still followed by her physician in [facility],  urology  Breast cancer screening Last mammogram in 2018.  No findings of concern for malignancy on that check. Follow-up recommended in 1 year. She is overdue, and she notes that and a mammogram ordered today. She is also due for a woman's health evaluation, and will be scheduled with a female colleague of mine in the near future.  Patient Active Problem List   Diagnosis Date Noted  . Upper back pain 06/24/2019  . Hx of bladder cancer 01/22/2018  . Obesity (BMI 30.0-34.9) 01/22/2018  . Fibromyalgia 05/26/2016  . HLD (hyperlipidemia) 05/26/2016  . DD (diverticular disease) 10/23/2014     No current outpatient medications on file.   Allergies  Allergen Reactions  . Penicillins Swelling    Has patient had a PCN reaction causing immediate rash, facial/tongue/throat swelling, SOB or lightheadedness with hypotension: Yes Has patient had a PCN reaction causing severe rash involving mucus membranes or skin necrosis: No Has patient had a PCN reaction that required hospitalization No Has patient had a PCN reaction occurring within the last 10 years: No If all of the above answers are "NO", then may proceed with Cephalosporin use.   . Sulfa Antibiotics Hives     Past Surgical History:  Procedure Laterality Date  . Bledder cancer  (2007) 2008   as stated [facility]/Urology in [facility]. BCG  treatments weekly x21 followed every year.  Marland Kitchen DILATATION & CURETTAGE/HYSTEROSCOPY WITH MYOSURE N/A 12/04/2014   Procedure: DILATATION & CURETTAGE/HYSTEROSCOPY WITH MYOSURE;  Surgeon: Boykin Nearing, MD;  Location: [facility] ORS;  Service: Gynecology;  Laterality: N/A;  . Right Toe fracture Right 2009   Right First Toe surgery secondary to fracture     Family History  Problem Relation Age of Onset  . Depression Mother   . Breast cancer Mother 65  . Diabetes Father   . Gout Father   . Melanoma Father   . Hyperlipidemia Sister   . Hypertension Sister   . Aneurysm Maternal Grandmother   .  Breast cancer Paternal Grandmother   . Heart attack Paternal Grandfather      Social History   Tobacco Use  . Smoking status: Former Smoker    Packs/day: 0.25    Years: 30.00    Pack years: 7.50    Types: Cigarettes    Quit date: 02/05/2017    Years since quitting: 2.8  . Smokeless tobacco: Never Used  Substance Use Topics  . Alcohol use: No    With staff assistance, above reviewed with the patient today.  ROS: As per HPI, otherwise no specific complaints on a limited and focused system review   No results found for this or any previous visit (from the past 72 hour(s)).   PHQ2/9: Depression screen [facility] 2/9 12/27/2019 06/24/2019 01/22/2018 05/26/2016  Decreased Interest 0 0 0 0  Down, Depressed, Hopeless 0 0 0 0  PHQ - 2 Score 0 0 0 0  Altered sleeping - 3 0 -  Tired, decreased energy - 2 0 -  Change in appetite - 1 0 -  Feeling bad or failure about yourself  - 0 0 -  Trouble concentrating - 0 0 -  Moving slowly or fidgety/restless - 0 0 -  Suicidal thoughts - 0 0 -  PHQ-9 Score - 6 0 -  Difficult doing work/chores - Not difficult at all Not difficult at all -   PHQ-2/9 Result is neg  Fall Risk: Fall Risk  12/27/2019 06/24/2019 01/22/2018 05/26/2016  Falls in the past year? 0 0 No No  Number falls in past yr: 0 0 - -  Injury with Fall? 0 0 - -  Follow up Falls evaluation completed - - -      Objective:   Vitals:   12/27/19 0745  Pulse: 99  Resp: 16  Temp: 98 F (36.7 C)  TempSrc: Oral  SpO2: 99%  Weight: 201 lb 9.6 oz (91.4 kg)  Height: 5\' 6"  (1.676 m)    Body mass index is 32.54 kg/m.  Physical Exam   NAD, masked, very pleasant HEENT - [place]/AT, sclera anicteric, positive glasses, PERRL, EOMI, conj - non-inj'ed, TM's and canals clear, pharynx clear Neck - supple, no adenopathy, no TM, carotids 2+ and = without bruits bilat Car - RRR without m/g/r Pulm- RR and effort normal at rest, CTA without wheeze or rales Abd - soft, NT diffusely, ND, obese Back -  no CVA tenderness, question a soft tissue prominence in the right upper back below the neck, nontender to palpate, no overlying skin changes with no erythema, she notes has been that way recently, with her massage therapist working on that area to help relieve muscle tightness. Skin- no rash noted on exposed areas,  Ext - no LE edema, Neuro/psychiatric - affect was not flat, appropriate with conversation  Alert and oriented  Grossly non-focal   Speech  normal   Results for orders placed or performed in visit on 05/04/19  Novel Coronavirus, NAA ([facility])   Specimen: Nasopharyngeal(NP) swabs in vial transport medium   NASOPHARYNGE  TESTING  Result Value Ref Range   SARS-CoV-2, NAA Not Detected Not Detected       Assessment & Plan:    1. Mixed hyperlipidemia Noted on prior labs, and will recheck a lipid panel today Has not been on medication in the past to help. - Lipid panel - COMPLETE METABOLIC PANEL WITH GFR - Hemoglobin A1c - TSH  2. Obesity (BMI 30.0-34.9) Has lost about 10 pounds since March, and has tried to lose weight, although notes that has been challenging. Encouraged to continue with efforts We will add a TSH to check with her labs. - COMPLETE METABOLIC PANEL WITH GFR - Hemoglobin A1c - TSH  3. Hx of bladder cancer Continues to follow with her doctor in [place] [facility]  4. Encounter for screening mammogram for breast cancer She is overdue for a mammogram, and ordered Also will schedule a wellness health evaluation with pelvic/Pap needs addressed with a female colleague here in the very near future. - MM 3D SCREEN BREAST BILATERAL; Future  5. Screening for diabetes mellitus We will check an A1c with her labs. - Hemoglobin A1c  6. Need for influenza vaccination  - Flu Vaccine QUAD 6+ mos PF IM (Fluarix Quad PF)  7.  Soft tissue prominence-right upper back Discussed with patient likely a benign entity, and soft tissue based,  and continue to closely monitor.  If is having increased symptoms, or is increasing in size, needs to follow-up as we discussed.  May need further evaluation with imaging such as an ultrasound over time.   We will ask the laboratory person from [facility] about specifics with a biometric screening through [place], and if there is something more specific needed.  Do feel the labs ordered would cover most requests for that screen. Await those results presently. As above, follow-up with a female colleague for woman's health annual exam, and follow-up sooner as needed.    Towanda Malkin, MD 12/27/19 7:48 AM

## 2019-12-27 ENCOUNTER — Ambulatory Visit (INDEPENDENT_AMBULATORY_CARE_PROVIDER_SITE_OTHER): Payer: No Typology Code available for payment source | Admitting: Internal Medicine

## 2019-12-27 ENCOUNTER — Other Ambulatory Visit: Payer: Self-pay

## 2019-12-27 ENCOUNTER — Encounter: Payer: Self-pay | Admitting: Internal Medicine

## 2019-12-27 VITALS — BP 124/76 | HR 99 | Temp 98.0°F | Resp 16 | Ht 66.0 in | Wt 201.6 lb

## 2019-12-27 DIAGNOSIS — Z131 Encounter for screening for diabetes mellitus: Secondary | ICD-10-CM

## 2019-12-27 DIAGNOSIS — Z8551 Personal history of malignant neoplasm of bladder: Secondary | ICD-10-CM | POA: Diagnosis not present

## 2019-12-27 DIAGNOSIS — E782 Mixed hyperlipidemia: Secondary | ICD-10-CM

## 2019-12-27 DIAGNOSIS — E669 Obesity, unspecified: Secondary | ICD-10-CM | POA: Diagnosis not present

## 2019-12-27 DIAGNOSIS — Z1231 Encounter for screening mammogram for malignant neoplasm of breast: Secondary | ICD-10-CM

## 2019-12-27 DIAGNOSIS — Z23 Encounter for immunization: Secondary | ICD-10-CM | POA: Diagnosis not present

## 2019-12-27 DIAGNOSIS — E66811 Obesity, class 1: Secondary | ICD-10-CM

## 2019-12-28 LAB — COMPLETE METABOLIC PANEL WITH GFR
AG Ratio: 1.5 (calc) (ref 1.0–2.5)
ALT: 20 U/L (ref 6–29)
AST: 19 U/L (ref 10–35)
Albumin: 4.6 g/dL (ref 3.6–5.1)
Alkaline phosphatase (APISO): 109 U/L (ref 37–153)
BUN: 14 mg/dL (ref 7–25)
CO2: 28 mmol/L (ref 20–32)
Calcium: 9.7 mg/dL (ref 8.6–10.4)
Chloride: 103 mmol/L (ref 98–110)
Creat: 0.61 mg/dL (ref 0.50–0.99)
GFR, Est African American: 113 mL/min/{1.73_m2} (ref >=60)
GFR, Est Non African American: 97 mL/min/{1.73_m2} (ref >=60)
Globulin: 3 g/dL (calc) (ref 1.9–3.7)
Glucose, Bld: 98 mg/dL (ref 65–99)
Potassium: 4.5 mmol/L (ref 3.5–5.3)
Sodium: 142 mmol/L (ref 135–146)
Total Bilirubin: 0.7 mg/dL (ref 0.2–1.2)
Total Protein: 7.6 g/dL (ref 6.1–8.1)

## 2019-12-28 LAB — TSH: TSH: 1.11 mIU/L (ref 0.40–4.50)

## 2019-12-28 LAB — LIPID PANEL
Cholesterol: 224 mg/dL — ABNORMAL HIGH (ref <200)
HDL: 47 mg/dL — ABNORMAL LOW (ref >=50)
LDL Cholesterol (Calc): 140 mg/dL (calc) — ABNORMAL HIGH
Non-HDL Cholesterol (Calc): 177 mg/dL (calc) — ABNORMAL HIGH (ref <130)
Total CHOL/HDL Ratio: 4.8 (calc) (ref <5.0)
Triglycerides: 220 mg/dL — ABNORMAL HIGH (ref <150)

## 2019-12-28 LAB — HEMOGLOBIN A1C
Hgb A1c MFr Bld: 5.9 % of total Hgb — ABNORMAL HIGH (ref <5.7)
Mean Plasma Glucose: 123 (calc)
eAG (mmol/L): 6.8 (calc)

## 2019-12-31 ENCOUNTER — Other Ambulatory Visit: Payer: Self-pay

## 2019-12-31 ENCOUNTER — Emergency Department
Admission: EM | Admit: 2019-12-31 | Discharge: 2020-01-01 | Disposition: A | Discharge status: Home / Self Care | Payer: No Typology Code available for payment source | Attending: Emergency Medicine | Admitting: Emergency Medicine

## 2019-12-31 ENCOUNTER — Emergency Department: Payer: No Typology Code available for payment source

## 2019-12-31 ENCOUNTER — Encounter: Payer: Self-pay | Admitting: Emergency Medicine

## 2019-12-31 DIAGNOSIS — Z8551 Personal history of malignant neoplasm of bladder: Secondary | ICD-10-CM | POA: Insufficient documentation

## 2019-12-31 DIAGNOSIS — Z87891 Personal history of nicotine dependence: Secondary | ICD-10-CM | POA: Diagnosis not present

## 2019-12-31 DIAGNOSIS — K5792 Diverticulitis of intestine, part unspecified, without perforation or abscess without bleeding: Secondary | ICD-10-CM | POA: Insufficient documentation

## 2019-12-31 DIAGNOSIS — R109 Unspecified abdominal pain: Secondary | ICD-10-CM | POA: Diagnosis present

## 2019-12-31 LAB — URINALYSIS, COMPLETE (UACMP) WITH MICROSCOPIC
Bilirubin Urine: NEGATIVE
Glucose, UA: 150 mg/dL — AB
Hgb urine dipstick: NEGATIVE
Ketones, ur: NEGATIVE mg/dL
Leukocytes,Ua: NEGATIVE
Nitrite: NEGATIVE
Protein, ur: NEGATIVE mg/dL
Specific Gravity, Urine: 1.026 (ref 1.005–1.030)
pH: 5 (ref 5.0–8.0)

## 2019-12-31 LAB — CBC
HCT: 40.7 % (ref 36.0–46.0)
Hemoglobin: 14.4 g/dL (ref 12.0–15.0)
MCH: 30.1 pg (ref 26.0–34.0)
MCHC: 35.4 g/dL (ref 30.0–36.0)
MCV: 85 fL (ref 80.0–100.0)
Platelets: 272 10*3/uL (ref 150–400)
RBC: 4.79 MIL/uL (ref 3.87–5.11)
RDW: 13.1 % (ref 11.5–15.5)
WBC: 10.3 10*3/uL (ref 4.0–10.5)
nRBC: 0 % (ref 0.0–0.2)

## 2019-12-31 LAB — COMPREHENSIVE METABOLIC PANEL
ALT: 25 U/L (ref 0–44)
AST: 21 U/L (ref 15–41)
Albumin: 4.4 g/dL (ref 3.5–5.0)
Alkaline Phosphatase: 100 U/L (ref 38–126)
Anion gap: 11 (ref 5–15)
BUN: 13 mg/dL (ref 8–23)
CO2: 24 mmol/L (ref 22–32)
Calcium: 8.9 mg/dL (ref 8.9–10.3)
Chloride: 103 mmol/L (ref 98–111)
Creatinine, Ser: 0.63 mg/dL (ref 0.44–1.00)
GFR calc Af Amer: >60 mL/min (ref >60)
GFR calc non Af Amer: >60 mL/min (ref >60)
Glucose, Bld: 135 mg/dL — ABNORMAL HIGH (ref 70–99)
Potassium: 3.6 mmol/L (ref 3.5–5.1)
Sodium: 138 mmol/L (ref 135–145)
Total Bilirubin: 0.7 mg/dL (ref 0.3–1.2)
Total Protein: 8.2 g/dL — ABNORMAL HIGH (ref 6.5–8.1)

## 2019-12-31 LAB — LIPASE, BLOOD: Lipase: 48 U/L (ref 11–51)

## 2019-12-31 MED ORDER — IOHEXOL 300 MG/ML  SOLN
100.0000 mL | Freq: Once | INTRAMUSCULAR | Status: AC | PRN
  Administered 2019-12-31: 100 mL via INTRAVENOUS

## 2019-12-31 MED ORDER — LACTATED RINGERS IV BOLUS
1000.0000 mL | Freq: Once | INTRAVENOUS | Status: AC
  Administered 2019-12-31: 1000 mL via INTRAVENOUS

## 2019-12-31 MED ORDER — ONDANSETRON HCL 4 MG/2ML IJ SOLN
4.0000 mg | Freq: Once | INTRAMUSCULAR | Status: AC
  Administered 2019-12-31: 4 mg via INTRAVENOUS
  Filled 2019-12-31: qty 2

## 2019-12-31 MED ORDER — MORPHINE SULFATE (PF) 4 MG/ML IV SOLN
4.0000 mg | Freq: Once | INTRAVENOUS | Status: AC
  Administered 2019-12-31: 4 mg via INTRAVENOUS
  Filled 2019-12-31: qty 1

## 2019-12-31 NOTE — ED Provider Notes (Signed)
[facility] Emergency Department Provider Note  ____________________________________________  Time seen: Approximately 11:18 PM  I have reviewed the triage vital signs and the nursing notes.   HISTORY  Chief Complaint Abdominal Pain   HPI Rebekah Myers is a 64 y.o. female with a history of bladder cancer in 2008 on remission, fibromyalgia, diverticulosis, arthritis who presents for evaluation of abdominal pain.  Patient reports 2 days of constant left-sided abdominal pain that radiates to her back.  The pain is dull, 4 out of 10 with intermittent sharp 10 out of 10 component.  She has had more than 20 episodes of watery diarrhea associated with it.  No nausea or vomiting, no fever or chills, no chest pain or shortness of breath, no dysuria or hematuria although she does endorse more frequent urination.  No melena, hematemesis, hematochezia, coffee-ground emesis.  No history of C. difficile, no recent antibiotic use.   Past Medical History:  Diagnosis Date  . Arthritis   . Bladder cancer ([place]) 2008   surgery and 6 weeks of chemo tx  . Diverticulosis 07/19/2014  . Fibromyalgia 2014  . History of colon polyps   . Hyperlipidemia 06/28/2009   mixed    Patient Active Problem List   Diagnosis Date Noted  . Upper back pain 06/24/2019  . Hx of bladder cancer 01/22/2018  . Obesity (BMI 30.0-34.9) 01/22/2018  . Fibromyalgia 05/26/2016  . HLD (hyperlipidemia) 05/26/2016  . DD (diverticular disease) 10/23/2014    Past Surgical History:  Procedure Laterality Date  . [place] cancer  (2007) 2008   as stated [facility]/Urology in [facility]. BCG treatments weekly x21 followed every year.  Marland Kitchen DILATATION & CURETTAGE/HYSTEROSCOPY WITH MYOSURE N/A 12/04/2014   Procedure: DILATATION & CURETTAGE/HYSTEROSCOPY WITH MYOSURE;  Surgeon: Boykin Nearing, MD;  Location: [facility] ORS;  Service: Gynecology;  Laterality: N/A;  . Right Toe fracture Right 2009   Right First Toe surgery  secondary to fracture    Prior to Admission medications   Medication Sig Start Date End Date Taking? Authorizing Provider  ciprofloxacin (CIPRO) 500 MG tablet Take 1 tablet (500 mg total) by mouth 2 (two) times daily for 7 days. 01/01/20 01/08/20  Rudene Re, MD  metroNIDAZOLE (FLAGYL) 500 MG tablet Take 1 tablet (500 mg total) by mouth 3 (three) times daily for 7 days. 01/01/20 01/08/20  Rudene Re, MD  ondansetron (ZOFRAN ODT) 4 MG disintegrating tablet Take 1 tablet (4 mg total) by mouth every 8 (eight) hours as needed. 01/01/20   Rudene Re, MD  traMADol (ULTRAM) 50 MG tablet Take 1 tablet (50 mg total) by mouth every 6 (six) hours as needed. 01/01/20 12/31/20  Rudene Re, MD    Allergies Penicillins and Sulfa antibiotics  Family History  Problem Relation Age of Onset  . Depression Mother   . Breast cancer Mother 47  . Diabetes Father   . Gout Father   . Melanoma Father   . Hyperlipidemia Sister   . Hypertension Sister   . Aneurysm Maternal Grandmother   . Breast cancer Paternal Grandmother   . Heart attack Paternal Grandfather     Social History Social History   Tobacco Use  . Smoking status: Former Smoker    Packs/day: 0.25    Years: 30.00    Pack years: 7.50    Types: Cigarettes    Quit date: 02/05/2017    Years since quitting: 2.9  . Smokeless tobacco: Never Used  Vaping Use  . Vaping Use: Never used  Substance Use Topics  . Alcohol use: No  . Drug use: No    Review of Systems  Constitutional: Negative for fever. Eyes: Negative for visual changes. ENT: Negative for sore throat. Neck: No neck pain  Cardiovascular: Negative for chest pain. Respiratory: Negative for shortness of breath. Gastrointestinal: + abdominal pain and diarrhea. No vomiting Genitourinary: Negative for dysuria. Musculoskeletal: Negative for back pain. Skin: Negative for rash. Neurological: Negative for headaches, weakness or numbness. Psych: No SI or  HI  ____________________________________________   PHYSICAL EXAM:  VITAL SIGNS: ED Triage Vitals  Enc Vitals Group     BP 12/31/19 1657 (!) 148/73     Pulse Rate 12/31/19 1657 85     Resp 12/31/19 1657 16     Temp 12/31/19 1657 98.3 F (36.8 C)     Temp Source 12/31/19 1657 Oral     SpO2 12/31/19 1657 96 %     Weight 12/31/19 1658 201 lb (91.2 kg)     Height 12/31/19 1658 5\' 6"  (1.676 m)     Head Circumference --      Peak Flow --      Pain Score 12/31/19 1658 7     Pain Loc --      Pain Edu? --      Excl. in [place]? --     Constitutional: Alert and oriented, looks uncomfortable due to pain.  HEENT:      Head: Normocephalic and atraumatic.         Eyes: Conjunctivae are normal. Sclera is non-icteric.       Mouth/Throat: Mucous membranes are moist.       Neck: Supple with no signs of meningismus. Cardiovascular: Regular rate and rhythm. No murmurs, gallops, or rubs. 2+ symmetrical distal pulses are present in all extremities.  Respiratory: Normal respiratory effort. Lungs are clear to auscultation bilaterally. No wheezes, crackles, or rhonchi.  Gastrointestinal: Soft, diffusely tender to palpation worse on the left with localized guarding on the LUQ Genitourinary: No CVA tenderness. Musculoskeletal: No edema, cyanosis, or erythema of extremities. Neurologic: Normal speech and language. Face is symmetric. Moving all extremities. No gross focal neurologic deficits are appreciated. Skin: Skin is warm, dry and intact. No rash noted. Psychiatric: Mood and affect are normal. Speech and behavior are normal.  ____________________________________________   LABS (all labs ordered are listed, but only abnormal results are displayed)  Labs Reviewed  COMPREHENSIVE METABOLIC PANEL - Abnormal; Notable for the following components:      Result Value   Glucose, Bld 135 (*)    Total Protein 8.2 (*)    All other components within normal limits  URINALYSIS, COMPLETE (UACMP) WITH  MICROSCOPIC - Abnormal; Notable for the following components:   Color, Urine YELLOW (*)    APPearance HAZY (*)    Glucose, UA 150 (*)    Bacteria, UA RARE (*)    All other components within normal limits  C DIFFICILE QUICK SCREEN W PCR REFLEX  LIPASE, BLOOD  CBC   ____________________________________________  EKG  none  ____________________________________________  RADIOLOGY  I have personally reviewed the images performed during this visit and I agree with the Radiologist's read.   Interpretation by Radiologist:  CT ABDOMEN PELVIS W CONTRAST  Result Date: 12/31/2019 CLINICAL DATA:  Abdominal pain the radiates to the back on the left side. Multiple episodes of diarrhea. EXAM: CT ABDOMEN AND PELVIS WITH CONTRAST TECHNIQUE: Multidetector CT imaging of the abdomen and pelvis was performed using the standard protocol following bolus administration  of intravenous contrast. CONTRAST:  131mL OMNIPAQUE IOHEXOL 300 MG/ML  SOLN COMPARISON:  CT dated 02/05/2016 FINDINGS: Lower chest: The lung bases are clear. The heart is enlarged. Hepatobiliary: There is decreased hepatic attenuation suggestive of hepatic steatosis. Normal gallbladder.There is no biliary ductal dilation. Pancreas: Normal contours without ductal dilatation. No peripancreatic fluid collection. Spleen: Unremarkable. Adrenals/Urinary Tract: --Adrenal glands: Unremarkable. --Right kidney/ureter: No hydronephrosis or radiopaque kidney stones. --Left kidney/ureter: No hydronephrosis or radiopaque kidney stones. --Urinary bladder: Unremarkable. Stomach/Bowel: --Stomach/Duodenum: No hiatal hernia or other gastric abnormality. Normal duodenal course and caliber. --Small bowel: Unremarkable. --Colon: There are scattered colonic diverticula with findings consistent with uncomplicated diverticulitis involving the distal descending colon. --Appendix: Not visualized. No right lower quadrant inflammation or free fluid. Vascular/Lymphatic:  Atherosclerotic calcification is present within the non-aneurysmal abdominal aorta, without hemodynamically significant stenosis. --No retroperitoneal lymphadenopathy. --No mesenteric lymphadenopathy. --No pelvic or inguinal lymphadenopathy. Reproductive: Unremarkable Other: No ascites or free air. The abdominal wall is normal. Musculoskeletal. No acute displaced fractures. IMPRESSION: 1. Uncomplicated diverticulitis involving the distal descending colon. 2. Hepatic steatosis. 3. Cardiomegaly. Aortic Atherosclerosis (ICD10-I70.0). Electronically Signed   By: Constance Holster M.D.   On: 12/31/2019 23:55      ____________________________________________   PROCEDURES  Procedure(s) performed:yes  .1-3 Lead EKG Interpretation Performed by: Rudene Re, MD Authorized by: Rudene Re, MD     Interpretation: normal     ECG rate assessment: normal     Rhythm: sinus rhythm     Ectopy: none     Critical Care performed:  None ____________________________________________   INITIAL IMPRESSION / ASSESSMENT AND PLAN / ED COURSE  64 y.o. female with a history of bladder cancer in 2008 on remission, fibromyalgia, diverticulosis, arthritis who presents for evaluation of L sided abdominal pain x 2 days associated with diarrhea and urinary frequency.  On exam she looks uncomfortable due to pain but is otherwise hemodynamically stable, afebrile, abdomen is soft with diffuse tenderness which is worse on the left quadrants with localized guarding of the left upper quadrant.  Differential diagnosis including colitis versus bowel perforation versus diverticulitis versus gastroenteritis versus kidney stone versus pyelonephritis.  Labs showing mild hyperglycemia and a nonfasting blood work with a glucose of 135.  No history of diabetes.  Discussed this finding with patient and recommended fasting recheck with PCP once she has recovered from this illness.  Normal LFTs and lipase, normal  electrolytes.  CBC with no leukocytosis, no anemia, no thrombocytosis.  UA with mild glycosuria but no evidence of blood or UTI.  We will give IV morphine and Zofran for symptoms, IV fluids for mild dehydration, and get a CT of the abdomen and pelvis.  Patient placed on telemetry for close monitoring.  Old medical records reviewed.     _________________________ 12:41 AM on 01/01/2020 -----------------------------------------  CT visualized by me consistent with uncomplicated diverticulitis, confirmed by radiology.  No signs of sepsis.  Patient was given a dose of Cipro and Flagyl and will be discharged home on a 7-day course.  Unfortunately patient has allergies to penicillins and sulfa therefore the only other option is Cipro.  I did discuss with patient all the risks and blackbox warnings associated with Cipro.  Patient has taken in the past with diverticulitis and feels comfortable taking it again.  Discussed follow-up with PCP and return to the hospital if pain is getting worse or if patient has a fever.  Will provide a course of Zofran and tramadol for symptom relief at home.  Recommended Tylenol  as well for pain.  Also discussed avoiding alcohol while on Flagyl.  _____________________________________________ Please note:  Patient was evaluated in Emergency Department today for the symptoms described in the history of present illness. Patient was evaluated in the context of the global COVID-19 pandemic, which necessitated consideration that the patient might be at risk for infection with the SARS-CoV-2 virus that causes COVID-19. Institutional protocols and algorithms that pertain to the evaluation of patients at risk for COVID-19 are in a state of rapid change based on information released by regulatory bodies including the CDC and federal and state organizations. These policies and algorithms were followed during the patient's care in the ED.  Some ED evaluations and interventions may be delayed  as a result of limited staffing during the pandemic.    Controlled Substance Database was reviewed by me. ____________________________________________   FINAL CLINICAL IMPRESSION(S) / ED DIAGNOSES   Final diagnoses:  Diverticulitis      NEW MEDICATIONS STARTED DURING THIS VISIT:  ED Discharge Orders         Ordered    ciprofloxacin (CIPRO) 500 MG tablet  2 times daily        01/01/20 0036    metroNIDAZOLE (FLAGYL) 500 MG tablet  3 times daily        01/01/20 0036    ondansetron (ZOFRAN ODT) 4 MG disintegrating tablet  Every 8 hours PRN        01/01/20 0036    traMADol (ULTRAM) 50 MG tablet  Every 6 hours PRN        01/01/20 0036           Note:  This document was prepared using Dragon voice recognition software and may include unintentional dictation errors.    Alfred Levins, [place], MD 01/01/20 [phone]

## 2019-12-31 NOTE — ED Notes (Signed)
Patient transported to CT 

## 2019-12-31 NOTE — ED Triage Notes (Signed)
Pt to ED via POV c/o pain that radiates from her abdomen around into her back on the left side. Pt reports having about 20 episodes of diarrhea since yesterday. Pt states that abdomen is tender to touch on the side. Pt deneis vomiting. Hx/o diverticulitis.

## 2020-01-01 MED ORDER — METRONIDAZOLE 500 MG PO TABS: 500.0000 mg | ORAL_TABLET | Freq: Three times a day (TID) | ORAL | 0 refills | Status: AC

## 2020-01-01 MED ORDER — TRAMADOL HCL 50 MG PO TABS
50.0000 mg | ORAL_TABLET | Freq: Once | ORAL | Status: AC
  Administered 2020-01-01: 50 mg via ORAL
  Filled 2020-01-01: qty 1

## 2020-01-01 MED ORDER — CIPROFLOXACIN HCL 500 MG PO TABS: 500.0000 mg | ORAL_TABLET | Freq: Two times a day (BID) | ORAL | 0 refills | Status: AC

## 2020-01-01 MED ORDER — CIPROFLOXACIN HCL 500 MG PO TABS
500.0000 mg | ORAL_TABLET | Freq: Once | ORAL | Status: AC
  Administered 2020-01-01: 500 mg via ORAL
  Filled 2020-01-01: qty 1

## 2020-01-01 MED ORDER — METRONIDAZOLE 500 MG PO TABS
500.0000 mg | ORAL_TABLET | Freq: Once | ORAL | Status: AC
  Administered 2020-01-01: 500 mg via ORAL
  Filled 2020-01-01: qty 1

## 2020-01-01 MED ORDER — ONDANSETRON 4 MG PO TBDP: 4.0000 mg | ORAL_TABLET | Freq: Three times a day (TID) | ORAL | 0 refills | Status: DC | PRN

## 2020-01-01 MED ORDER — ACETAMINOPHEN 500 MG PO TABS
1000.0000 mg | ORAL_TABLET | Freq: Once | ORAL | Status: AC
  Administered 2020-01-01: 1000 mg via ORAL
  Filled 2020-01-01: qty 2

## 2020-01-01 MED ORDER — TRAMADOL HCL 50 MG PO TABS: 50.0000 mg | ORAL_TABLET | Freq: Four times a day (QID) | ORAL | 0 refills | Status: DC | PRN

## 2020-02-08 ENCOUNTER — Encounter: Payer: Self-pay | Admitting: Family Medicine

## 2020-02-08 ENCOUNTER — Other Ambulatory Visit: Payer: Self-pay

## 2020-02-08 ENCOUNTER — Other Ambulatory Visit (HOSPITAL_COMMUNITY)
Admission: RE | Admit: 2020-02-08 | Discharge: 2020-02-08 | Disposition: A | Discharge status: Home / Self Care | Payer: No Typology Code available for payment source | Source: Ambulatory Visit | Attending: Family Medicine | Admitting: Family Medicine

## 2020-02-08 ENCOUNTER — Ambulatory Visit (INDEPENDENT_AMBULATORY_CARE_PROVIDER_SITE_OTHER): Payer: No Typology Code available for payment source | Admitting: Family Medicine

## 2020-02-08 VITALS — BP 124/72 | HR 78 | Temp 98.0°F | Resp 16 | Ht 66.0 in | Wt 197.2 lb

## 2020-02-08 DIAGNOSIS — Z124 Encounter for screening for malignant neoplasm of cervix: Secondary | ICD-10-CM | POA: Insufficient documentation

## 2020-02-08 DIAGNOSIS — Z0000 Encounter for general adult medical examination without abnormal findings: Secondary | ICD-10-CM

## 2020-02-08 NOTE — Progress Notes (Signed)
Patient: Rebekah Myers, Female    DOB: Dec 14, 1955, 64 y.o.   MRN: [id number] Towanda Malkin, MD Visit Date: 02/08/2020  Today's Provider: Delsa Grana, PA-C   Chief Complaint  Patient presents with  . Annual Exam   Subjective:   Annual physical exam:  Rebekah Myers is a 64 y.o. female who presents today for complete physical exam:  Pt of Dr. Roxan Hockey, labs done recently, reviewed ER visit recently for abd pain and dx with diverticulitis, questions about note of cardiomegaly - reviewed imaging with pt, she denies any palpitations, chest pain, lower extremity edema, palpitations, orthopnea, PND, dyspnea on exertion  Exercise/Activity: Exercises about once a week  Due for PAP today Mammogram also due, ordered previously -she is going to come to the mobile mammogram truck on November 15  USPSTF grade A and B recommendations - reviewed and addressed today  Depression:  Phq 9 completed today by patient, was reviewed by me with patient in the room PHQ score is neg, pt feels good PHQ 2/9 Scores 02/08/2020 12/27/2019 06/24/2019 01/22/2018  PHQ - 2 Score 0 0 0 0  PHQ- 9 Score - - 6 0   Depression screen [facility] 2/9 02/08/2020 12/27/2019 06/24/2019 01/22/2018 05/26/2016  Decreased Interest 0 0 0 0 0  Down, Depressed, Hopeless 0 0 0 0 0  PHQ - 2 Score 0 0 0 0 0  Altered sleeping - - 3 0 -  Tired, decreased energy - - 2 0 -  Change in appetite - - 1 0 -  Feeling bad or failure about yourself  - - 0 0 -  Trouble concentrating - - 0 0 -  Moving slowly or fidgety/restless - - 0 0 -  Suicidal thoughts - - 0 0 -  PHQ-9 Score - - 6 0 -  Difficult doing work/chores - - Not difficult at all Not difficult at all -    Alcohol screening:   Office Visit from 02/08/2020 in [facility]  AUDIT-C Score 0      Immunizations and Health Maintenance: Health Maintenance  Topic Date Due  . MAMMOGRAM  07/28/2017  . PAP SMEAR-Modifier  07/24/2019  . COLONOSCOPY  03/13/2020    . TETANUS/TDAP  03/03/2022  . INFLUENZA VACCINE  Completed  . COVID-19 Vaccine  Completed  . Hepatitis C Screening  Completed  . HIV Screening  Completed     Hep C Screening: done  STD testing and prevention (HIV/chl/gon/syphilis):  see above, no additional testing desired by pt today  Intimate partner violence:  none, denies  Sexual History/Pain during Intercourse: Married  Menstrual History/LMP/Abnormal Bleeding: no AUB, menopause roughly 5 years ago No LMP recorded. Patient is postmenopausal.  Incontinence Symptoms: none  Breast cancer: DUE Last Mammogram: see HM list above   Cervical cancer screening: due Pt mom with breast CA, otherwise no other family hx of cancers - breast, ovarian, uterine, colon:     Osteoporosis:   Discussion on osteoporosis per age, including high calcium and vitamin D supplementation, weight bearing exercises Pt is not supplementing with daily calcium/Vit D.  Skin cancer:  Hx of skin CA -  NO Discussed atypical lesions   Colorectal cancer:   Colonoscopy is due December 2021 Discussed concerning signs and sx of CRC, pt denies melena, hematochezie or change in BM  Lung cancer:   Low Dose CT Chest recommended if Age 53-80 years, 30 pack-year currently smoking OR have quit w/in 15years. Patient does qualify.  10-12 cig a day   Social History   Tobacco Use  . Smoking status: Former Smoker    Packs/day: 0.25    Years: 30.00    Pack years: 7.50    Types: Cigarettes    Quit date: 02/05/2017    Years since quitting: 3.0  . Smokeless tobacco: Never Used  Vaping Use  . Vaping Use: Never used  Substance Use Topics  . Alcohol use: No  . Drug use: No       [facility] from 02/08/[facility]  AUDIT-C Score 0      Family History  Problem Relation Age of Onset  . Depression Mother   . Breast cancer Mother 52  . Diabetes Father   . Gout Father   . Melanoma Father   . Hyperlipidemia Sister   .  Hypertension Sister   . Aneurysm Maternal Grandmother   . Breast cancer Paternal Grandmother   . Heart attack Paternal Grandfather      Blood pressure/Hypertension: BP Readings from Last 3 Encounters:  02/08/20 124/72  01/01/20 136/77  12/27/19 124/76    Weight/Obesity: Wt Readings from Last 3 Encounters:  02/08/20 197 lb 3.2 oz (89.4 kg)  12/31/19 201 lb (91.2 kg)  12/27/19 201 lb 9.6 oz (91.4 kg)   BMI Readings from Last 3 Encounters:  02/08/20 31.83 kg/m  12/31/19 32.44 kg/m  12/27/19 32.54 kg/m     Lipids:  Lab Results  Component Value Date   CHOL 224 (H) 12/27/2019   CHOL 203 (H) 01/22/2018   CHOL 206 (H) 10/27/2016   Lab Results  Component Value Date   HDL 47 (L) 12/27/2019   HDL 45 (L) 01/22/2018   HDL 41.00 10/27/2016   Lab Results  Component Value Date   LDLCALC 140 (H) 12/27/2019   LDLCALC 122 (H) 01/22/2018   Lab Results  Component Value Date   TRIG 220 (H) 12/27/2019   TRIG 255 (H) 01/22/2018   TRIG 256.0 (H) 10/27/2016   Lab Results  Component Value Date   CHOLHDL 4.8 12/27/2019   CHOLHDL 4.5 01/22/2018   CHOLHDL 5 10/27/2016   Lab Results  Component Value Date   LDLDIRECT 129.0 10/27/2016   LDLDIRECT 167.0 07/23/2016   Based on the results of lipid panel his/her cardiovascular risk factor ( using [facility] )  in the next 10 years is: The 10-year ASCVD risk score Mikey Bussing DC Brooke Bonito., et al., 2013) is: 4.5%   Values used to calculate the score:     Age: 42 years     Sex: Female     Is Non-Hispanic African American: No     Diabetic: No     Tobacco smoker: No     Systolic Blood Pressure: 161 mmHg     Is BP treated: No     HDL Cholesterol: 47 mg/dL     Total Cholesterol: 224 mg/dL Glucose:  Glucose  Date Value Ref Range Status  07/19/2014 130 (H) mg/dL Final    Comment:    65-99 NOTE: New Reference Range  06/13/14   12/03/2013 81 65 - 99 mg/dL Final  12/02/2013 155 (H) 65 - 99 mg/dL Final   Glucose, Bld  Date Value Ref Range  Status  12/31/2019 135 (H) 70 - 99 mg/dL Final    Comment:    Glucose reference range applies only to samples taken after fasting for at least 8 hours.  12/27/2019 98 65 - 99 mg/dL Final    Comment:    .  Fasting reference interval .   01/22/2018 76 65 - 99 mg/dL Final    Comment:    .            Fasting reference interval .    Hypertension: BP Readings from Last 3 Encounters:  02/08/20 124/72  01/01/20 136/77  12/27/19 124/76   Obesity: Wt Readings from Last 3 Encounters:  02/08/20 197 lb 3.2 oz (89.4 kg)  12/31/19 201 lb (91.2 kg)  12/27/19 201 lb 9.6 oz (91.4 kg)   BMI Readings from Last 3 Encounters:  02/08/20 31.83 kg/m  12/31/19 32.44 kg/m  12/27/19 32.54 kg/m    Social History      She        Social History   Socioeconomic History  . Marital status: Married    Spouse name: Coralyn Mark  . Number of children: 1  . Years of education: 91  . Highest education level: Associate degree: occupational, Hotel manager, or vocational program  Occupational History  . Occupation: Armed forces operational officer  Tobacco Use  . Smoking status: Former Smoker    Packs/day: 0.25    Years: 30.00    Pack years: 7.50    Types: Cigarettes    Quit date: 02/05/2017    Years since quitting: 3.0  . Smokeless tobacco: Never Used  Vaping Use  . Vaping Use: Never used  Substance and Sexual Activity  . Alcohol use: No  . Drug use: No  . Sexual activity: Yes    Birth control/protection: Post-menopausal  Other Topics Concern  . Not on file  Social History Narrative  . Not on file   Social Determinants of Health   Financial Resource Strain: Low Risk   . Difficulty of Paying Living Expenses: Not hard at all  Food Insecurity: No Food Insecurity  . Worried About Charity fundraiser in the Last Year: Never true  . Ran Out of Food in the Last Year: Never true  Transportation Needs: No Transportation Needs  . Lack of Transportation (Medical): No  . Lack of Transportation (Non-Medical):  No  Physical Activity: Insufficiently Active  . Days of Exercise per Week: 1 day  . Minutes of Exercise per Session: 20 min  Stress: No Stress Concern Present  . Feeling of Stress : Only a little  Social Connections: Socially Integrated  . Frequency of Communication with Friends and Family: More than three times a week  . Frequency of Social Gatherings with Friends and Family: More than three times a week  . Attends Religious Services: 1 to 4 times per year  . Active Member of Clubs or Organizations: Yes  . Attends Archivist Meetings: More than 4 times per year  . Marital Status: Married    Family History        Family History  Problem Relation Age of Onset  . Depression Mother   . Breast cancer Mother 80  . Diabetes Father   . Gout Father   . Melanoma Father   . Hyperlipidemia Sister   . Hypertension Sister   . Aneurysm Maternal Grandmother   . Breast cancer Paternal Grandmother   . Heart attack Paternal Grandfather     Patient Active Problem List   Diagnosis Date Noted  . Upper back pain 06/24/2019  . Hx of bladder cancer 01/22/2018  . Obesity (BMI 30.0-34.9) 01/22/2018  . Fibromyalgia 05/26/2016  . HLD (hyperlipidemia) 05/26/2016  . DD (diverticular disease) 10/23/2014    Past Surgical History:  Procedure Laterality Date  . [place]  cancer  (2007) 2008   as stated [facility]/Urology in [facility]. BCG treatments weekly x21 followed every year.  Marland Kitchen DILATATION & CURETTAGE/HYSTEROSCOPY WITH MYOSURE N/A 12/04/2014   Procedure: DILATATION & CURETTAGE/HYSTEROSCOPY WITH MYOSURE;  Surgeon: Boykin Nearing, MD;  Location: [facility] ORS;  Service: Gynecology;  Laterality: N/A;  . Right Toe fracture Right 2009   Right First Toe surgery secondary to fracture     Current Outpatient Medications:  .  ondansetron (ZOFRAN ODT) 4 MG disintegrating tablet, Take 1 tablet (4 mg total) by mouth every 8 (eight) hours as needed. (Patient not taking: Reported on 02/08/2020), Disp:  20 tablet, Rfl: 0 .  traMADol (ULTRAM) 50 MG tablet, Take 1 tablet (50 mg total) by mouth every 6 (six) hours as needed. (Patient not taking: Reported on 02/08/2020), Disp: 20 tablet, Rfl: 0  Allergies  Allergen Reactions  . Penicillins Swelling    Has patient had a PCN reaction causing immediate rash, facial/tongue/throat swelling, SOB or lightheadedness with hypotension: Yes Has patient had a PCN reaction causing severe rash involving mucus membranes or skin necrosis: No Has patient had a PCN reaction that required hospitalization No Has patient had a PCN reaction occurring within the last 10 years: No If all of the above answers are "NO", then may proceed with Cephalosporin use.   Ignacia Bayley Antibiotics Hives    Patient Care Team: Towanda Malkin, MD as PCP - General (Internal Medicine)  Review of Systems  10 Systems reviewed and are negative for acute change except as noted in the HPI.  I personally reviewed active problem list, medication list, allergies, family history, social history, health maintenance, notes from last encounter, lab results, imaging with the patient/caregiver today.        Objective:   Vitals:  Vitals:   02/08/20 1045  BP: 124/72  Pulse: 78  Resp: 16  Temp: 98 F (36.7 C)  TempSrc: Oral  SpO2: 99%  Weight: 197 lb 3.2 oz (89.4 kg)  Height: 5\' 6"  (1.676 m)    Body mass index is 31.83 kg/m.  Physical Exam Vitals and nursing note reviewed. Exam conducted with a chaperone present.  Constitutional:      General: She is not in acute distress.    Appearance: Normal appearance. She is well-developed. She is obese. She is not ill-appearing, toxic-appearing or diaphoretic.     Interventions: Face mask in place.  HENT:     Head: Normocephalic and atraumatic.     Right Ear: External ear normal.     Left Ear: External ear normal.  Eyes:     General: Lids are normal. No scleral icterus.       Right eye: No discharge.        Left eye: No  discharge.     Conjunctiva/sclera: Conjunctivae normal.  Neck:     Trachea: Phonation normal. No tracheal deviation.  Cardiovascular:     Rate and Rhythm: Normal rate and regular rhythm.     Pulses: Normal pulses.          Radial pulses are 2+ on the right side and 2+ on the left side.       Posterior tibial pulses are 2+ on the right side and 2+ on the left side.     Heart sounds: Normal heart sounds. No murmur heard.  No friction rub. No gallop.   Pulmonary:     Effort: Pulmonary effort is normal. No respiratory distress.     Breath sounds: Normal breath sounds.  No stridor. No wheezing, rhonchi or rales.  Chest:     Chest wall: No tenderness.  Abdominal:     General: Bowel sounds are normal. There is no distension.     Palpations: Abdomen is soft.  Genitourinary:    General: Normal vulva.     Labia:        Right: No rash.        Left: No rash.      Urethra: No prolapse.     Vagina: Normal.     Cervix: No cervical motion tenderness, friability, erythema or eversion.     Uterus: Normal.      Adnexa: Right adnexa normal and left adnexa normal.       Right: No mass, tenderness or fullness.         Left: No mass, tenderness or fullness.    Musculoskeletal:     Right lower leg: No edema.     Left lower leg: No edema.  Skin:    General: Skin is warm and dry.     Coloration: Skin is not jaundiced or pale.     Findings: No rash.  Neurological:     Mental Status: She is alert.     Motor: No abnormal muscle tone.     Gait: Gait normal.  Psychiatric:        Mood and Affect: Mood normal.        Speech: Speech normal.        Behavior: Behavior normal.       Fall Risk: Fall Risk  02/08/2020 12/27/2019 06/24/2019 01/22/2018 05/26/2016  Falls in the past year? 1 0 0 No No  Number falls in past yr: 0 0 0 - -  Injury with Fall? 0 0 0 - -  Follow up Falls evaluation completed Falls evaluation completed - - -    Functional Status Survey: Is the patient deaf or have difficulty  hearing?: No Does the patient have difficulty seeing, even when wearing glasses/contacts?: No Does the patient have difficulty concentrating, remembering, or making decisions?: No Does the patient have difficulty walking or climbing stairs?: No Does the patient have difficulty dressing or bathing?: No Does the patient have difficulty doing errands alone such as visiting a doctor's office or shopping?: No   Assessment & Plan:    CPE completed today  . USPSTF grade A and B recommendations reviewed with patient; age-appropriate recommendations, preventive care, screening tests, etc discussed and encouraged; healthy living encouraged; see AVS for patient education given to patient  . Discussed importance of 150 minutes of physical activity weekly, AHA exercise recommendations given to pt in AVS/handout  . Discussed importance of healthy diet:  eating lean meats and proteins, avoiding trans fats and saturated fats, avoid simple sugars and excessive carbs in diet, eat 6 servings of fruit/vegetables daily and drink plenty of water and avoid sweet beverages.    . Recommended pt to do annual eye exam and routine dental exams/cleanings  . Depression, alcohol, fall screening completed as documented above and per flowsheets  . Reviewed Health Maintenance: Health Maintenance  Topic Date Due  . MAMMOGRAM  07/28/2017  . PAP SMEAR-Modifier  07/24/2019  . COLONOSCOPY  03/13/2020  . TETANUS/TDAP  03/03/2022  . INFLUENZA VACCINE  Completed  . COVID-19 Vaccine  Completed  . Hepatitis C Screening  Completed  . HIV Screening  Completed    . Immunizations: Immunization History  Administered Date(s) Administered  . Influenza,inj,Quad PF,6+ Mos 01/22/2018, 12/27/2019  . PFIZER  SARS-COV-2 Vaccination 07/08/2019, 08/03/2019  . Pneumococcal Polysaccharide-23 03/03/2012, 02/07/2015  . Tdap 03/03/2012      ICD-10-CM   1. Adult general medical exam  Z00.00 Cytology - PAP  2. Screening for malignant  neoplasm of cervix  Z12.4 Cytology - PAP       Delsa Grana, PA-C 02/08/20 11:00 AM  [facility]

## 2020-02-08 NOTE — Patient Instructions (Addendum)
.[facility] [address] [place],  [place]  [address] Get Driving Directions Main: [phone]    Preventive Care 62-62 Years Old, Female Preventive care refers to visits with your health care provider and lifestyle choices that can promote health and wellness. This includes:  A yearly physical exam. This may also be called an annual well check.  Regular dental visits and eye exams.  Immunizations.  Screening for certain conditions.  Healthy lifestyle choices, such as eating a healthy diet, getting regular exercise, not using drugs or products that contain nicotine and tobacco, and limiting alcohol use. What can I expect for my preventive care visit? Physical exam Your health care provider will check your:  Height and weight. This may be used to calculate body mass index (BMI), which tells if you are at a healthy weight.  Heart rate and blood pressure.  Skin for abnormal spots. Counseling Your health care provider may ask you questions about your:  Alcohol, tobacco, and drug use.  Emotional well-being.  Home and relationship well-being.  Sexual activity.  Eating habits.  Work and work environment.  Method of birth control.  Menstrual cycle.  Pregnancy history. What immunizations do I need?  Influenza (flu) vaccine  This is recommended every year. Tetanus, diphtheria, and pertussis (Tdap) vaccine  You may need a Td booster every 10 years. Varicella (chickenpox) vaccine  You may need this if you have not been vaccinated. Zoster (shingles) vaccine  You may need this after age 60. Measles, mumps, and rubella (MMR) vaccine  You may need at least one dose of MMR if you were born in 1957 or later. You may also need a second dose. Pneumococcal conjugate (PCV13) vaccine  You may need this if you have certain conditions and were not previously vaccinated. Pneumococcal polysaccharide (PPSV23) vaccine  You may need  one or two doses if you smoke cigarettes or if you have certain conditions. Meningococcal conjugate (MenACWY) vaccine  You may need this if you have certain conditions. Hepatitis A vaccine  You may need this if you have certain conditions or if you travel or work in places where you may be exposed to hepatitis A. Hepatitis B vaccine  You may need this if you have certain conditions or if you travel or work in places where you may be exposed to hepatitis B. Haemophilus influenzae type b (Hib) vaccine  You may need this if you have certain conditions. Human papillomavirus (HPV) vaccine  If recommended by your health care provider, you may need three doses over 6 months. You may receive vaccines as individual doses or as more than one vaccine together in one shot (combination vaccines). Talk with your health care provider about the risks and benefits of combination vaccines. What tests do I need? Blood tests  Lipid and cholesterol levels. These may be checked every 5 years, or more frequently if you are over 50 years old.  Hepatitis C test.  Hepatitis B test. Screening  Lung cancer screening. You may have this screening every year starting at age 55 if you have a 30-pack-year history of smoking and currently smoke or have quit within the past 15 years.  Colorectal cancer screening. All adults should have this screening starting at age 50 and continuing until age 75. Your health care provider may recommend screening at age 45 if you are at increased risk. You will have tests every 1-10 years, depending on your results and the type of screening test.    Diabetes screening. This is done by checking your blood sugar (glucose) after you have not eaten for a while (fasting). You may have this done every 1-3 years.  Mammogram. This may be done every 1-2 years. Talk with your health care provider about when you should start having regular mammograms. This may depend on whether you have a family  history of breast cancer.  BRCA-related cancer screening. This may be done if you have a family history of breast, ovarian, tubal, or peritoneal cancers.  Pelvic exam and Pap test. This may be done every 3 years starting at age 21. Starting at age 30, this may be done every 5 years if you have a Pap test in combination with an HPV test. Other tests  Sexually transmitted disease (STD) testing.  Bone density scan. This is done to screen for osteoporosis. You may have this scan if you are at high risk for osteoporosis. Follow these instructions at home: Eating and drinking  Eat a diet that includes fresh fruits and vegetables, whole grains, lean protein, and low-fat dairy.  Take vitamin and mineral supplements as recommended by your health care provider.  Do not drink alcohol if: ? Your health care provider tells you not to drink. ? You are pregnant, may be pregnant, or are planning to become pregnant.  If you drink alcohol: ? Limit how much you have to 0-1 drink a day. ? Be aware of how much alcohol is in your drink. In the [place] one drink equals one 12 oz bottle of beer (355 mL), one 5 oz glass of wine (148 mL), or one 1 oz glass of hard liquor (44 mL). Lifestyle  Take daily care of your teeth and gums.  Stay active. Exercise for at least 30 minutes on 5 or more days each week.  Do not use any products that contain nicotine or tobacco, such as cigarettes, e-cigarettes, and chewing tobacco. If you need help quitting, ask your health care provider.  If you are sexually active, practice safe sex. Use a condom or other form of birth control (contraception) in order to prevent pregnancy and STIs (sexually transmitted infections).  If told by your health care provider, take low-dose aspirin daily starting at age 50. What's next?  Visit your health care provider once a year for a well check visit.  Ask your health care provider how often you should have your eyes and teeth  checked.  Stay up to date on all vaccines. This information is not intended to replace advice given to you by your health care provider. Make sure you discuss any questions you have with your health care provider. Document Revised: 12/03/2017 Document Reviewed: 12/03/2017 Elsevier Patient Education  2020 Elsevier Inc.   Preventing Osteoporosis, Adult Osteoporosis is a condition that causes the bones to lose density. This means that the bones become thinner, and the normal spaces in bone tissue become larger. Low bone density can make the bones weak and cause them to break more easily. Osteoporosis cannot always be prevented, but you can take steps to lower your risk of developing this condition. How can this condition affect me? If you develop osteoporosis, you will be more likely to break bones in your wrist, spine, or hip. Even a minor accident or injury can be enough to break weak bones. The bones will also be slower to heal. Osteoporosis can cause other problems as well, such as a stooped posture or trouble with movement. Osteoporosis can occur with aging. As you get   older, you may lose bone tissue more quickly, or it may be replaced more slowly. Osteoporosis is more likely to develop if you have poor nutrition or do not get enough calcium or vitamin D. Other lifestyle factors can also play a role. By eating a well-balanced diet and making lifestyle changes, you can help keep your bones strong and healthy, lowering your chances of developing osteoporosis. What can increase my risk? The following factors may make you more likely to develop osteoporosis:  Having a family history of the condition.  Having poor nutrition or not getting enough calcium or vitamin D.  Using certain medicines, such as steroid medicines or antiseizure medicines.  Being any of the following: ? 50 years of age or older. ? Female. ? A woman who has gone through menopause (is postmenopausal). ? White (Caucasian)  or of Asian descent.  Smoking or having a history of smoking.  Not being physically active (being sedentary).  Having a small body frame. What actions can I take to prevent this?  Get enough calcium   Make sure you get enough calcium every day. Calcium is the most important mineral for bone health. Most people can get enough calcium from their diet, but supplements may be recommended for people who are at risk for osteoporosis. Follow these guidelines: ? If you are age 50 or younger, aim to get 1,000 mg of calcium every day. ? If you are older than age 50, aim to get 1,200 mg of calcium every day.  Good sources of calcium include: ? Dairy products, such as low-fat or nonfat milk, cheese, and yogurt. ? Dark green leafy vegetables, such as bok choy and broccoli. ? Foods that have had calcium added to them (calcium-fortified foods), such as orange juice, cereal, bread, soy beverages, and tofu products. ? Nuts, such as almonds.  Check nutrition labels to see how much calcium is in a food or drink. Get enough vitamin D  Try to get enough vitamin D every day. Vitamin D is the most essential vitamin for bone health. It helps the body absorb calcium. Follow these guidelines for how much vitamin D to get from food: ? If you are age 70 or younger, aim to get at least 600 international units (IU) every day. Your health care provider may suggest more. ? If you are older than age 70, aim to get at least 800 international units every day. Your health care provider may suggest more.  Good sources of vitamin D in your diet include: ? Egg yolks. ? Oily fish, such as salmon, sardines, and tuna. ? Milk and cereal fortified with vitamin D.  Your body also makes vitamin D when you are out in the sun. Exposing the bare skin on your face, arms, legs, or back to the sun for no more than 30 minutes a day, 2 times a week is more than enough. Beyond that, make sure you use sunblock to protect your skin from  sunburn, which increases your risk for skin cancer. Exercise  Stay active and get exercise every day.  Ask your health care provider what types of exercise are best for you. Weight-bearing and strength-building activities are important for building and maintaining healthy bones. Some examples of these types of activities include: ? Walking and hiking. ? Jogging and running. ? Dancing. ? Gym exercises. ? Lifting weights. ? Tennis and racquetball. ? Climbing stairs. ? Aerobics. Make other lifestyle changes  Do not use any products that contain nicotine or tobacco, such   as cigarettes, e-cigarettes, and chewing tobacco. If you need help quitting, ask your health care provider.  Lose weight if you are overweight.  If you drink alcohol: ? Limit how much you use to:  0-1 drink a day for nonpregnant women.  0-2 drinks a day for men. ? Be aware of how much alcohol is in your drink. In the [place] one drink equals one 12 oz bottle of beer (355 mL), one 5 oz glass of wine (148 mL), or one 1 oz glass of hard liquor (44 mL). Where to find support If you need help making changes to prevent osteoporosis, talk with your health care provider. You can ask for a referral to a diet and nutrition specialist (dietitian) and a physical therapist. Where to find more information Learn more about osteoporosis from:  NIH Osteoporosis and Related Bone Diseases National Resource Center: www.bones.nih.gov  U.S. Office on Women's Health: www.womenshealth.gov  National Osteoporosis Foundation: www.nof.org Summary  Osteoporosis is a condition that causes weak bones that are more likely to break.  Eat a healthy diet, making sure you get enough calcium and vitamin D, and stay active by getting regular exercise to help prevent osteoporosis.  Other ways to reduce your risk of osteoporosis include maintaining a healthy weight and avoiding alcohol and products that contain nicotine or tobacco. This information  is not intended to replace advice given to you by your health care provider. Make sure you discuss any questions you have with your health care provider. Document Revised: 10/22/2018 Document Reviewed: 10/22/2018 Elsevier Patient Education  2020 Elsevier Inc.  

## 2020-02-09 LAB — CYTOLOGY - PAP
Comment: NEGATIVE
Diagnosis: NEGATIVE
High risk HPV: NEGATIVE

## 2020-02-13 ENCOUNTER — Telehealth: Payer: Self-pay

## 2020-02-13 DIAGNOSIS — Z87891 Personal history of nicotine dependence: Secondary | ICD-10-CM

## 2020-02-13 DIAGNOSIS — Z122 Encounter for screening for malignant neoplasm of respiratory organs: Secondary | ICD-10-CM

## 2020-02-13 NOTE — Telephone Encounter (Signed)
Contacted patient for lung CT screening clinic based on referral from Delsa Grana.  Patient agreeable to program and CT scheduled for Mon, Nov 29 at 10:30.  Patient is a former smoker who stopped smoking 8 months ago.  She started smoking consistently at age 64, but smoked some beginning at age 48.  She smoked between a 1/2 to 1 pack a day throughout the years.  She knows where imaging center is and was given phone number to Burgess Estelle, lung navigator.  She is grateful to be in the program as she shared both of her parents had cancer and she is a survivor of bladder cancer 10 years ago.

## 2020-02-14 ENCOUNTER — Encounter: Payer: Self-pay | Admitting: *Deleted

## 2020-02-14 NOTE — Addendum Note (Signed)
Addended by: Lieutenant Diego on: 02/14/2020 12:53 PM   Modules accepted: Orders

## 2020-02-14 NOTE — Telephone Encounter (Signed)
Former smoker, quit 06/14/19, 33 pack year history

## 2020-03-05 ENCOUNTER — Inpatient Hospital Stay: Payer: No Typology Code available for payment source | Attending: Nurse Practitioner | Admitting: Nurse Practitioner

## 2020-03-05 ENCOUNTER — Other Ambulatory Visit: Payer: Self-pay

## 2020-03-05 ENCOUNTER — Ambulatory Visit
Admission: RE | Admit: 2020-03-05 | Discharge: 2020-03-05 | Disposition: A | Discharge status: Home / Self Care | Payer: No Typology Code available for payment source | Source: Ambulatory Visit | Attending: Nurse Practitioner | Admitting: Nurse Practitioner

## 2020-03-05 DIAGNOSIS — Z87891 Personal history of nicotine dependence: Secondary | ICD-10-CM

## 2020-03-05 DIAGNOSIS — Z122 Encounter for screening for malignant neoplasm of respiratory organs: Secondary | ICD-10-CM | POA: Insufficient documentation

## 2020-03-05 NOTE — Progress Notes (Signed)
Virtual Visit via Video Enabled Telemedicine Note   I connected with Rebekah Myers on 03/05/20 at 10:30 AM EST by video enabled telemedicine visit and verified that I am speaking with the correct person using two identifiers.   I discussed the limitations, risks, security and privacy concerns of performing an evaluation and management service by telemedicine and the availability of in-person appointments. I also discussed with the patient that there may be a patient responsible charge related to this service. The patient expressed understanding and agreed to proceed.   Other persons participating in the visit and their role in the encounter: Burgess Estelle, RN- checking in patient & navigation  Patient's location: [facility]  Provider's location: Clinic  Chief Complaint: Low Dose CT Screening  Patient agreed to evaluation by telemedicine to discuss shared decision making for consideration of low dose CT lung cancer screening.    In accordance with CMS guidelines, patient has met eligibility criteria including age, absence of signs or symptoms of lung cancer.  Social History   Tobacco Use  . Smoking status: Former Smoker    Packs/day: 0.75    Years: 44.00    Pack years: 33.00    Types: Cigarettes    Quit date: 06/14/2019    Years since quitting: 0.7  . Smokeless tobacco: Never Used  Substance Use Topics  . Alcohol use: No     A shared decision-making session was conducted prior to the performance of CT scan. This includes one or more decision aids, includes benefits and harms of screening, follow-up diagnostic testing, over-diagnosis, false positive rate, and total radiation exposure.   Counseling on the importance of adherence to annual lung cancer LDCT screening, impact of co-morbidities, and ability or willingness to undergo diagnosis and treatment is imperative for compliance of the program.   Counseling on the importance of continued smoking cessation for former smokers; the  importance of smoking cessation for current smokers, and information about tobacco cessation interventions have been given to patient including [name] and [name] programs.   Written order for lung cancer screening with LDCT has been given to the patient and any and all questions have been answered to the best of my abilities.    Yearly follow up will be coordinated by Burgess Estelle, Thoracic Navigator.  I discussed the assessment and treatment plan with the patient. The patient was provided an opportunity to ask questions and all were answered. The patient agreed with the plan and demonstrated an understanding of the instructions.   The patient was advised to [name] or seek an in-person evaluation if the symptoms worsen or if the condition fails to improve as anticipated.   I provided 15 minutes of face-to-face video visit time during this encounter, and > 50% was spent counseling as documented under my assessment & plan.   [name], DNP, AGNP-C [place] [facility] [phone] (clinic)

## 2020-03-06 ENCOUNTER — Telehealth: Payer: Self-pay | Admitting: *Deleted

## 2020-03-06 NOTE — Telephone Encounter (Signed)
Please have the patient schedule a follow up appt.to discuss her screening lung CT finding related to her kidney. Thanks

## 2020-03-06 NOTE — Telephone Encounter (Signed)
Notified patient of LDCT lung cancer screening program results with recommendation for 12 month follow up imaging. Also notified of incidental findings noted below and is encouraged to discuss further with PCP who will receive a copy of this note and/or the CT report. Discussed renal finding in detail and recommendation for further imaging. Patient verbalizes understanding.   IMPRESSION: 1. Lung-RADS 2S, benign appearance or behavior. Continue annual screening with low-dose chest CT without contrast in 12 months. 2. Aortic atherosclerosis. 3. Mild diffuse bronchial wall thickening with very mild centrilobular and paraseptal emphysema; imaging findings suggestive of underlying COPD. 4. Hepatic steatosis. 5. The "S" modifier above refers to potentially clinically significant non lung cancer related findings. Specifically, there is an indeterminate lesion in the upper pole the left kidney which was previously low-attenuation, now intermediate to high attenuation. This may simply represent an evolving hemorrhagic/proteinaceous cyst, however, definitive characterization with nonemergent abdominal MRI with and without IV gadolinium is suggested in the near future to exclude the possibility of a slowly progressive cystic renal neoplasm.  Aortic Atherosclerosis (ICD10-I70.0) and Emphysema (ICD10-J43.9).

## 2020-03-06 NOTE — Telephone Encounter (Signed)
Called patient to schedule follow up appointment for lung ct results.

## 2020-03-07 NOTE — Progress Notes (Signed)
Patient ID: Rebekah Myers, female    DOB: [birth year], 64 y.o.   MRN: [id number]  PCP: Towanda Malkin, MD  Chief Complaint  Patient presents with  . Follow-up    Subjective:   Rebekah Myers is a 64 y.o. female, presents to clinic with CC of the following:  Chief Complaint  Patient presents with  . Follow-up    HPI:  Patient is a 64 year old female Last visit was 12/07/19 She has a history of obesity, bladder cancer, hyperlipidemia noted. She had a screening lung CT and I was contacted with the following concerns:   Notified patient of LDCT lung cancer screening program results with recommendation for 12 month follow up imaging. Also notified of incidental findings noted below and is encouraged to discuss further with PCP who will receive a copy of this note and/or the CT report. Discussed renal finding in detail and recommendation for further imaging. Patient verbalizes understanding.   IMPRESSION: 1. Lung-RADS 2S, benign appearance or behavior. Continue annual screening with low-dose chest CT without contrast in 12 months. 2. Aortic atherosclerosis. 3. Mild diffuse bronchial wall thickening with very mild centrilobular and paraseptal emphysema; imaging findings suggestive of underlying COPD. 4. Hepatic steatosis. 5. The "S" modifier above refers to potentially clinically significant non lung cancer related findings. Specifically, there is an indeterminate lesion in the upper pole the left kidney which was previously low-attenuation, now intermediate to high attenuation. This may simply represent an evolving hemorrhagic/proteinaceous cyst, however, definitive characterization with nonemergent abdominal MRI with and without IV gadolinium is suggested in the near future to exclude the possibility of a slowly progressive cystic renal neoplasm.  I asked to have a follow-up with her to further discuss the above. She noted she had a long discussion with the  radiologist to notify her of the results. She notes she has been feeling well. She denies any recent symptoms of concern with no back pains, no difficulty urinating, no blood in the urine.  History of tobacco use-quit about 3 years ago (02/2017)  Colon cancer screen Colonoscopy in 03/14/2015; next due in five years, which will be this December, and reviewed this with her today.  History of bladder cancer Still followed by her physician in [facility], urology, last visit was pre-Covid, 2 years ago and she noted he is retiring and not following up with him now. She noted she went to a doctor in this building, a urologist, and was very unhappy with that visit with them recommending surgery, and then she returned to her doctor in [facility] who noted things were good and surgery was not needed.  She does not want to follow-up with that urologist again noted.  Breast cancer screening Last mammogram in 2018.  No findings of concern for malignancy on that check. Follow-up recommended in 1 year. She was overdue last visit and a mammogram was ordered and she has not yet obtained that.     Patient Active Problem List   Diagnosis Date Noted  . Upper back pain 06/24/2019  . Hx of bladder cancer 01/22/2018  . Obesity (BMI 30.0-34.9) 01/22/2018  . Fibromyalgia 05/26/2016  . HLD (hyperlipidemia) 05/26/2016  . DD (diverticular disease) 10/23/2014     No current outpatient medications on file.   Allergies  Allergen Reactions  . Penicillins Swelling    Has patient had a PCN reaction causing immediate rash, facial/tongue/throat swelling, SOB or lightheadedness with hypotension: Yes Has patient had a PCN reaction causing  severe rash involving mucus membranes or skin necrosis: No Has patient had a PCN reaction that required hospitalization No Has patient had a PCN reaction occurring within the last 10 years: No If all of the above answers are "NO", then may proceed with Cephalosporin use.    . Sulfa Antibiotics Hives     Past Surgical History:  Procedure Laterality Date  . Bledder cancer  (2007) 2008   as stated [facility]/Urology in [facility]. BCG treatments weekly x21 followed every year.  Marland Kitchen DILATATION & CURETTAGE/HYSTEROSCOPY WITH MYOSURE N/A 12/04/2014   Procedure: DILATATION & CURETTAGE/HYSTEROSCOPY WITH MYOSURE;  Surgeon: Boykin Nearing, MD;  Location: [facility] ORS;  Service: Gynecology;  Laterality: N/A;  . Right Toe fracture Right 2009   Right First Toe surgery secondary to fracture     Family History  Problem Relation Age of Onset  . Depression Mother   . Breast cancer Mother 58  . Diabetes Father   . Gout Father   . Melanoma Father   . Hyperlipidemia Sister   . Hypertension Sister   . Aneurysm Maternal Grandmother   . Breast cancer Paternal Grandmother   . Heart attack Paternal Grandfather      Social History   Tobacco Use  . Smoking status: Former Smoker    Packs/day: 0.75    Years: 44.00    Pack years: 33.00    Types: Cigarettes    Quit date: 06/14/2019    Years since quitting: 0.7  . Smokeless tobacco: Never Used  Substance Use Topics  . Alcohol use: No    With staff assistance, above reviewed with the patient today.  ROS: As per HPI, otherwise no specific complaints on a limited and focused system review   No results found for this or any previous visit (from the past 72 hour(s)).   PHQ2/9: Depression screen [facility] 2/9 03/08/2020 02/08/2020 12/27/2019 06/24/2019 01/22/2018  Decreased Interest 0 0 0 0 0  Down, Depressed, Hopeless 0 0 0 0 0  PHQ - 2 Score 0 0 0 0 0  Altered sleeping - - - 3 0  Tired, decreased energy - - - 2 0  Change in appetite - - - 1 0  Feeling bad or failure about yourself  - - - 0 0  Trouble concentrating - - - 0 0  Moving slowly or fidgety/restless - - - 0 0  Suicidal thoughts - - - 0 0  PHQ-9 Score - - - 6 0  Difficult doing work/chores - - - Not difficult at all Not difficult at all   PHQ-2/9 Result is  neg  Fall Risk: Fall Risk  03/08/2020 02/08/2020 12/27/2019 06/24/2019 01/22/2018  Falls in the past year? 0 1 0 0 No  Number falls in past yr: 0 0 0 0 -  Injury with Fall? 0 0 0 0 -  Follow up - Falls evaluation completed Falls evaluation completed - -      Objective:   Vitals:   03/08/20 0907  BP: 130/72  Pulse: 86  Resp: 16  Temp: 97.8 F (36.6 C)  TempSrc: Oral  SpO2: 98%  Weight: 199 lb 8 oz (90.5 kg)  Height: 5\' 6"  (1.676 m)    Body mass index is 32.2 kg/m.  Physical Exam   NAD, masked, very pleasant HEENT - [place]/AT, sclera anicteric, Abd - soft, obese, NT diffusely, Back - no CVA tenderness bilateral Neuro/psychiatric - affect was not flat, appropriate with conversation  Alert and oriented             Speech  normal   Results for orders placed or performed in visit on 02/08/20  Cytology - PAP  Result Value Ref Range   High risk HPV Negative    Adequacy      Satisfactory for evaluation; transformation zone component PRESENT.   Diagnosis      - Negative for intraepithelial lesion or malignancy (NILM)   Comment Normal Reference Range HPV - Negative        Assessment & Plan:    1. cyst of kidney? 2. Renal lesion Discussed with patient the findings from the screening CT scan as noted above. It was recommended to pursue a follow-up imaging study, an MRI to help further evaluate.  She has been followed with a urologist in [facility] with her history of bladder cancer noted, and did offer following up with that doctor as an option before obtaining that an MRI that was recommended, and she noted she no longer will be following up with a neurologist as he is retiring.  Do feel she needs to continue to follow with urology as she had been recommended to do so yearly. Referral was placed for urology, and she wanted to talk to her doctor who is retiring to get recommendations for a possible urologist to follow-up with in the area.  I did put a generic referral  in, and await who she will be following up with. She did want to pursue the MRI study, and felt that was reasonable, and that was ordered. Did note after that result, may need to have an oncologist or nephrologist involved as well, and await that result presently.  - MR Abdomen W Wo Contrast; Future  3.  Colon cancer screening She is due for a repeat colonoscopy 5 years from her last, which is due this month.  A referral was placed to GI to have his colonoscopy done.  She was in agreement with having this time.  4.  Breast cancer screening She also has not had a mammogram done since 2018, and ordered one on our last visit to obtain, and she has not had that done. She noted she will [place] to get this done, and I did recommend that as well today.  Await the MRI results, and I did note to her today that I will be leaving this practice, and the importance of following up with another provider in the near future to help continue to manage over time will be needed as well.   Towanda Malkin, MD 03/08/20 9:23 AM

## 2020-03-08 ENCOUNTER — Encounter: Payer: Self-pay | Admitting: Internal Medicine

## 2020-03-08 ENCOUNTER — Ambulatory Visit: Payer: No Typology Code available for payment source | Admitting: Internal Medicine

## 2020-03-08 ENCOUNTER — Other Ambulatory Visit: Payer: Self-pay

## 2020-03-08 VITALS — BP 130/72 | HR 86 | Temp 97.8°F | Resp 16 | Ht 66.0 in | Wt 199.5 lb

## 2020-03-08 DIAGNOSIS — N281 Cyst of kidney, acquired: Secondary | ICD-10-CM | POA: Diagnosis not present

## 2020-03-08 DIAGNOSIS — Z1212 Encounter for screening for malignant neoplasm of rectum: Secondary | ICD-10-CM

## 2020-03-08 DIAGNOSIS — Z8551 Personal history of malignant neoplasm of bladder: Secondary | ICD-10-CM | POA: Diagnosis not present

## 2020-03-08 DIAGNOSIS — N289 Disorder of kidney and ureter, unspecified: Secondary | ICD-10-CM

## 2020-03-08 DIAGNOSIS — Z1211 Encounter for screening for malignant neoplasm of colon: Secondary | ICD-10-CM | POA: Diagnosis not present

## 2020-03-08 NOTE — Patient Instructions (Signed)
Referral provided for urology, gastroenterology for colonoscopy  Please obtain the mammogram ordered last visit as well  MRI ordered and await results

## 2020-03-13 ENCOUNTER — Telehealth: Payer: Self-pay

## 2020-03-13 NOTE — Telephone Encounter (Signed)
  Copied from [place] [id number]. Topic: Referral - Status >> Mar 13, 2020  9:41 AM Scherrie Gerlach wrote: Reason for CRM: pt states she was to hear back within 48 hours for urology referral/appt. Pt states please refer her to [facility] in [place] asap.  Not a pt of [facility] Patient. Please route accordingly.S.Helen Cuff.

## 2020-03-14 NOTE — Telephone Encounter (Signed)
Sent a message to referral to have this schedule.

## 2020-03-15 ENCOUNTER — Encounter: Payer: Self-pay | Admitting: Internal Medicine

## 2020-04-02 ENCOUNTER — Other Ambulatory Visit: Payer: No Typology Code available for payment source

## 2020-04-13 ENCOUNTER — Ambulatory Visit: Payer: No Typology Code available for payment source

## 2020-05-02 ENCOUNTER — Ambulatory Visit: Payer: No Typology Code available for payment source | Admitting: Gastroenterology

## 2020-05-05 DIAGNOSIS — U071 COVID-19: Secondary | ICD-10-CM

## 2020-05-05 HISTORY — DX: COVID-19: U07.1

## 2020-05-14 ENCOUNTER — Other Ambulatory Visit: Payer: Self-pay

## 2020-05-14 ENCOUNTER — Ambulatory Visit: Payer: No Typology Code available for payment source | Admitting: Gastroenterology

## 2020-05-14 ENCOUNTER — Encounter: Payer: Self-pay | Admitting: Gastroenterology

## 2020-05-14 VITALS — BP 119/79 | HR 76 | Temp 97.4°F | Ht 66.0 in | Wt 202.0 lb

## 2020-05-14 DIAGNOSIS — K5732 Diverticulitis of large intestine without perforation or abscess without bleeding: Secondary | ICD-10-CM

## 2020-05-14 DIAGNOSIS — Z86010 Personal history of colonic polyps: Secondary | ICD-10-CM

## 2020-05-14 MED ORDER — PEG 3350-KCL-NA BICARB-NACL 420 G PO SOLR: ORAL | 0 refills | Status: DC

## 2020-05-14 NOTE — Patient Instructions (Signed)
Diverticulitis  Diverticulitis is when small pouches in your colon (large intestine) get infected or swollen. This causes pain in the belly (abdomen) and watery poop (diarrhea). These pouches are called diverticula. The pouches form in people who have a condition called diverticulosis. What are the causes? This condition may be caused by poop (stool) that gets trapped in the pouches in your colon. The poop lets germs (bacteria) grow in the pouches. This causes the infection. What increases the risk? You are more likely to get this condition if you have small pouches in your colon. The risk is higher if:  You are overweight or very overweight (obese).  You do not exercise enough.  You drink alcohol.  You smoke or use products with tobacco in them.  You eat a diet that has a lot of red meat such as beef, pork, or lamb.  You eat a diet that does not have enough fiber in it.  You are older than 63 years of age. What are the signs or symptoms?  Pain in the belly. Pain is often on the left side, but it may be in other areas.  Fever and feeling cold.  Feeling like you may vomit.  Vomiting.  Having cramps.  Feeling full.  Changes to how often you poop.  Blood in your poop. How is this treated? Most cases are treated at home by:  Taking over-the-counter pain medicines.  Following a clear liquid diet.  Taking antibiotic medicines.  Resting. Very bad cases may need to be treated at a hospital. This may include:  Not eating or drinking.  Taking prescription pain medicine.  Getting antibiotic medicines through an IV tube.  Getting fluid and food through an IV tube.  Having surgery. When you are feeling better, your doctor may tell you to have a test to check your colon (colonoscopy). Follow these instructions at home: Medicines  Take over-the-counter and prescription medicines only as told by your doctor. These include: ? Antibiotics. ? Pain medicines. ? Fiber  pills. ? Probiotics. ? Stool softeners.  If you were prescribed an antibiotic medicine, take it as told by your doctor. Do not stop taking the antibiotic even if you start to feel better.  Ask your doctor if the medicine prescribed to you requires you to avoid driving or using machinery. Eating and drinking  Follow a diet as told by your doctor.  When you feel better, your doctor may tell you to change your diet. You may need to eat a lot of fiber. Fiber makes it easier to poop (have a bowel movement). Foods with fiber include: ? Berries. ? Beans. ? Lentils. ? Green vegetables.  Avoid eating red meat.   General instructions  Do not use any products that contain nicotine or tobacco, such as cigarettes, e-cigarettes, and chewing tobacco. If you need help quitting, ask your doctor.  Exercise 3 or more times a week. Try to get 30 minutes each time. Exercise enough to sweat and make your heart beat faster.  Keep all follow-up visits as told by your doctor. This is important. Contact a doctor if:  Your pain does not get better.  You are not pooping like normal. Get help right away if:  Your pain gets worse.  Your symptoms do not get better.  Your symptoms get worse very fast.  You have a fever.  You vomit more than one time.  You have poop that is: ? Bloody. ? Black. ? Tarry. Summary  This condition happens when   small pouches in your colon get infected or swollen.  Take medicines only as told by your doctor.  Follow a diet as told by your doctor.  Keep all follow-up visits as told by your doctor. This is important. This information is not intended to replace advice given to you by your health care provider. Make sure you discuss any questions you have with your health care provider. Document Revised: 01/03/2019 Document Reviewed: 01/03/2019 Elsevier Patient Education  2021 Elsevier Inc.  

## 2020-05-14 NOTE — Progress Notes (Signed)
Rebekah Myers [address]  [place]  [place], [place] [address]  Main: [phone]  Fax: [phone]   Gastroenterology Consultation  Referring Provider:     Towanda Malkin* Primary Care Physician:  Towanda Malkin, MD (Inactive) Reason for Consultation:     Abdominal pain        HPI:    Chief Complaint  Patient presents with  . Abdominal Pain    Rebekah Myers is a 65 y.o. y/o female referred for consultation & management  by Dr. Roxan Hockey, Carola Frost, MD (Inactive).  Patient reports going to the ER in September 2021 due to severe abdominal pain and was diagnosed with distal descending colon diverticulitis on CT scan.  A 2017 CT scan also shows diverticulitis at the hepatic flexure at that time.  2016 CT scan also shows acute uncomplicated diverticulitis involving the hepatic flexure.  Outside of the diverticulitis episodes patient does not have any severe abdominal pain.  Does report bilateral lower quadrant abdominal discomfort, 2/10, dull, nonradiating that occurs after meals.  However, states it does not bother her and does not affect her appetite.  No nausea or vomiting, weight loss.  Reports 3-4 soft bowel movements a day that is her baseline.  No blood in stool.  No family history of colon cancer.  Last colonoscopy, 2016 December, with Dr. Vira Agar done for history of polyps, showed internal hemorrhoids and diverticulosis.  Repeat was recommended in 5 years.  Colonoscopy reports prior to this not available but patient states polyps were removed previously.   Past Medical History:  Diagnosis Date  . Arthritis   . Bladder cancer ([place]) 2008   surgery and 6 weeks of chemo tx  . Diverticulosis 07/19/2014  . Fibromyalgia 2014  . History of colon polyps   . Hyperlipidemia 06/28/2009   mixed    Past Surgical History:  Procedure Laterality Date  . Bledder cancer  (2007) 2008   as stated [facility]/Urology in [facility]. BCG treatments weekly x21  followed every year.  Marland Kitchen DILATATION & CURETTAGE/HYSTEROSCOPY WITH MYOSURE N/A 12/04/2014   Procedure: DILATATION & CURETTAGE/HYSTEROSCOPY WITH MYOSURE;  Surgeon: Boykin Nearing, MD;  Location: [facility] ORS;  Service: Gynecology;  Laterality: N/A;  . Right Toe fracture Right 2009   Right First Toe surgery secondary to fracture    Prior to Admission medications   Medication Sig Start Date End Date Taking? Authorizing Provider  polyethylene glycol-electrolytes (NULYTELY) 420 g solution Prepare according to package instructions. Starting at 5:00 PM: Drink one 8 oz glass of mixture every 15 minutes until you finish half of the jug. Five hours prior to procedure, drink 8 oz glass of mixture every 15 minutes until it is all gone. Make sure you do not drink anything 4 hours prior to your procedure. 05/14/20  Yes Virgel Manifold, MD    Family History  Problem Relation Age of Onset  . Depression Mother   . Breast cancer Mother 20  . Diabetes Father   . Gout Father   . Melanoma Father   . Hyperlipidemia Sister   . Hypertension Sister   . Aneurysm Maternal Grandmother   . Breast cancer Paternal Grandmother   . Heart attack Paternal Grandfather      Social History   Tobacco Use  . Smoking status: Former Smoker    Packs/day: 0.75    Years: 44.00    Pack years: 33.00    Types: Cigarettes    Quit date: 06/14/2019  Years since quitting: 0.9  . Smokeless tobacco: Never Used  Vaping Use  . Vaping Use: Never used  Substance Use Topics  . Alcohol use: No  . Drug use: No    Allergies as of 05/14/2020 - Review Complete 05/14/2020  Allergen Reaction Noted  . Penicillins Swelling 07/28/2014  . Sulfa antibiotics Hives 07/28/2014    Review of Systems:    All systems reviewed and negative except where noted in HPI.   Physical Exam:  BP 119/79   Pulse 76   Temp (!) 97.4 F (36.3 C) (Oral)   Ht 5\' 6"  (1.676 m)   Wt 202 lb (91.6 kg)   BMI 32.60 kg/m  No LMP recorded. Patient is  postmenopausal. Psych:  Alert and cooperative. Normal mood and affect. General:   Alert,  Well-developed, well-nourished, pleasant and cooperative in NAD Head:  Normocephalic and atraumatic. Eyes:  Sclera clear, no icterus.   Conjunctiva pink. Ears:  Normal auditory acuity. Nose:  No deformity, discharge, or lesions. Mouth:  No deformity or lesions,oropharynx pink & moist. Neck:  Supple; no masses or thyromegaly. Abdomen:  Normal bowel sounds.  No bruits.  Soft, non-tender and non-distended without masses, hepatosplenomegaly or hernias noted.  No guarding or rebound tenderness.    Msk:  Symmetrical without gross deformities. Good, equal movement & strength bilaterally. Pulses:  Normal pulses noted. Extremities:  No clubbing or edema.  No cyanosis. Neurologic:  Alert and oriented x3;  grossly normal neurologically. Skin:  Intact without significant lesions or rashes. No jaundice. Lymph Nodes:  No significant cervical adenopathy. Psych:  Alert and cooperative. Normal mood and affect.   Labs: CBC    Component Value Date/Time   WBC 10.3 12/31/2019 1701   RBC 4.79 12/31/2019 1701   HGB 14.4 12/31/2019 1701   HGB 13.8 07/19/2014 1906   HCT 40.7 12/31/2019 1701   HCT 40.5 07/19/2014 1906   PLT 272 12/31/2019 1701   PLT 228 07/19/2014 1906   MCV 85.0 12/31/2019 1701   MCV 86 07/19/2014 1906   MCH 30.1 12/31/2019 1701   MCHC 35.4 12/31/2019 1701   RDW 13.1 12/31/2019 1701   RDW 13.8 07/19/2014 1906   LYMPHSABS 2,151 01/22/2018 1200   LYMPHSABS 2.3 07/19/2014 1906   MONOABS 0.6 07/19/2014 1906   EOSABS 182 01/22/2018 1200   EOSABS 0.2 07/19/2014 1906   BASOSABS 39 01/22/2018 1200   BASOSABS 0.1 07/19/2014 1906   CMP     Component Value Date/Time   NA 138 12/31/2019 1701   NA 143 07/24/2014 0000   NA 138 07/19/2014 1906   K 3.6 12/31/2019 1701   K 3.6 07/19/2014 1906   CL 103 12/31/2019 1701   CL 105 07/19/2014 1906   CO2 24 12/31/2019 1701   CO2 25 07/19/2014 1906    GLUCOSE 135 (H) 12/31/2019 1701   GLUCOSE 130 (H) 07/19/2014 1906   BUN 13 12/31/2019 1701   BUN 11 07/24/2014 0000   BUN 15 07/19/2014 1906   CREATININE 0.63 12/31/2019 1701   CREATININE 0.61 12/27/2019 0813   CALCIUM 8.9 12/31/2019 1701   CALCIUM 9.0 07/19/2014 1906   PROT 8.2 (H) 12/31/2019 1701   PROT 8.0 07/19/2014 1906   ALBUMIN 4.4 12/31/2019 1701   ALBUMIN 4.6 07/19/2014 1906   AST 21 12/31/2019 1701   AST 25 07/19/2014 1906   ALT 25 12/31/2019 1701   ALT 24 07/19/2014 1906   ALKPHOS 100 12/31/2019 1701   ALKPHOS 113 07/19/2014 1906   BILITOT  0.7 12/31/2019 1701   BILITOT 0.8 07/19/2014 1906   GFRNONAA >60 12/31/2019 1701   GFRNONAA 97 12/27/2019 0813   GFRAA >60 12/31/2019 1701   GFRAA 113 12/27/2019 0813    Imaging Studies: No results found.  Assessment and Plan:   Rebekah Myers is a 65 y.o. y/o female has been referred for abdominal pain  Patient referral to [place] was placed for colonoscopy due to her history of polyps, however, since she reported abdominal discomfort at the time of scheduling for colonoscopy, this appointment was made  She does not have abdominal pain at baseline, or as an alarming symptom.  Rather just abdominal bloating and mild discomfort after meals  I have advised her to increase her fiber intake  She is due for colonoscopy for history of polyps  In addition, given her recurrent diverticulitis episodes, and normal colonoscopy since her last episode of diverticulitis, important to rule out any underlying lesions at this time as well  I have discussed alternative options, risks & benefits,  which include, but are not limited to, bleeding, infection, perforation,respiratory complication & drug reaction.  The patient agrees with this plan & written consent will be obtained.    Low FODMAP diet discussed as well and handout given for the same as her abdominal bloating and discomfort may improve with this  Given recurrent episodes of  diverticulitis, will also refer to surgery for further evaluation  Dr Rebekah Myers  Speech recognition software was used to dictate the above note.

## 2020-05-21 ENCOUNTER — Other Ambulatory Visit: Payer: Self-pay

## 2020-05-21 ENCOUNTER — Encounter: Payer: Self-pay | Admitting: Gastroenterology

## 2020-05-22 ENCOUNTER — Encounter: Payer: Self-pay | Admitting: Anesthesiology

## 2020-05-24 ENCOUNTER — Other Ambulatory Visit: Admission: RE | Admit: 2020-05-24 | Payer: No Typology Code available for payment source | Source: Ambulatory Visit

## 2020-05-28 ENCOUNTER — Ambulatory Visit
Admission: RE | Admit: 2020-05-28 | Payer: No Typology Code available for payment source | Source: Home / Self Care | Admitting: Gastroenterology

## 2020-05-28 SURGERY — COLONOSCOPY WITH PROPOFOL
Anesthesia: Choice

## 2020-09-11 ENCOUNTER — Other Ambulatory Visit: Payer: Self-pay | Admitting: Internal Medicine

## 2020-09-11 DIAGNOSIS — Z1231 Encounter for screening mammogram for malignant neoplasm of breast: Secondary | ICD-10-CM

## 2020-09-21 ENCOUNTER — Ambulatory Visit
Admission: RE | Admit: 2020-09-21 | Discharge: 2020-09-21 | Disposition: A | Discharge status: Home / Self Care | Payer: No Typology Code available for payment source | Source: Ambulatory Visit | Attending: Internal Medicine | Admitting: Internal Medicine

## 2020-09-21 ENCOUNTER — Other Ambulatory Visit: Payer: Self-pay

## 2020-09-21 DIAGNOSIS — Z1231 Encounter for screening mammogram for malignant neoplasm of breast: Secondary | ICD-10-CM | POA: Insufficient documentation

## 2021-05-31 ENCOUNTER — Telehealth: Payer: Self-pay | Admitting: Acute Care

## 2021-05-31 NOTE — Telephone Encounter (Signed)
Left message for pt to [place] back to schedule f/u lung screening CT Scan.

## 2021-06-21 ENCOUNTER — Emergency Department: Payer: No Typology Code available for payment source

## 2021-06-21 ENCOUNTER — Other Ambulatory Visit: Payer: Self-pay

## 2021-06-21 ENCOUNTER — Emergency Department
Admission: EM | Admit: 2021-06-21 | Discharge: 2021-06-21 | Disposition: A | Discharge status: Home / Self Care | Payer: No Typology Code available for payment source | Attending: Emergency Medicine | Admitting: Emergency Medicine

## 2021-06-21 DIAGNOSIS — Z8551 Personal history of malignant neoplasm of bladder: Secondary | ICD-10-CM | POA: Diagnosis not present

## 2021-06-21 DIAGNOSIS — Z8616 Personal history of COVID-19: Secondary | ICD-10-CM | POA: Diagnosis not present

## 2021-06-21 DIAGNOSIS — R000 Tachycardia, unspecified: Secondary | ICD-10-CM | POA: Diagnosis not present

## 2021-06-21 DIAGNOSIS — D72829 Elevated white blood cell count, unspecified: Secondary | ICD-10-CM | POA: Diagnosis not present

## 2021-06-21 DIAGNOSIS — K5732 Diverticulitis of large intestine without perforation or abscess without bleeding: Secondary | ICD-10-CM | POA: Insufficient documentation

## 2021-06-21 DIAGNOSIS — R1032 Left lower quadrant pain: Secondary | ICD-10-CM | POA: Diagnosis present

## 2021-06-21 DIAGNOSIS — N2889 Other specified disorders of kidney and ureter: Secondary | ICD-10-CM | POA: Insufficient documentation

## 2021-06-21 DIAGNOSIS — K5792 Diverticulitis of intestine, part unspecified, without perforation or abscess without bleeding: Secondary | ICD-10-CM

## 2021-06-21 LAB — COMPREHENSIVE METABOLIC PANEL
ALT: 17 U/L (ref 0–44)
AST: 19 U/L (ref 15–41)
Albumin: 4.2 g/dL (ref 3.5–5.0)
Alkaline Phosphatase: 102 U/L (ref 38–126)
Anion gap: 13 (ref 5–15)
BUN: 10 mg/dL (ref 8–23)
CO2: 25 mmol/L (ref 22–32)
Calcium: 9.2 mg/dL (ref 8.9–10.3)
Chloride: 101 mmol/L (ref 98–111)
Creatinine, Ser: 0.69 mg/dL (ref 0.44–1.00)
GFR, Estimated: >60 mL/min (ref >60)
Glucose, Bld: 181 mg/dL — ABNORMAL HIGH (ref 70–99)
Potassium: 3.7 mmol/L (ref 3.5–5.1)
Sodium: 139 mmol/L (ref 135–145)
Total Bilirubin: 1 mg/dL (ref 0.3–1.2)
Total Protein: 8.2 g/dL — ABNORMAL HIGH (ref 6.5–8.1)

## 2021-06-21 LAB — CBC
HCT: 44.8 % (ref 36.0–46.0)
Hemoglobin: 14.6 g/dL (ref 12.0–15.0)
MCH: 28.7 pg (ref 26.0–34.0)
MCHC: 32.6 g/dL (ref 30.0–36.0)
MCV: 88.2 fL (ref 80.0–100.0)
Platelets: 259 10*3/uL (ref 150–400)
RBC: 5.08 MIL/uL (ref 3.87–5.11)
RDW: 12.9 % (ref 11.5–15.5)
WBC: 12.7 10*3/uL — ABNORMAL HIGH (ref 4.0–10.5)
nRBC: 0 % (ref 0.0–0.2)

## 2021-06-21 LAB — LIPASE, BLOOD: Lipase: 33 U/L (ref 11–51)

## 2021-06-21 MED ORDER — METRONIDAZOLE 500 MG PO TABS: 500.0000 mg | ORAL_TABLET | Freq: Three times a day (TID) | ORAL | 0 refills | Status: AC

## 2021-06-21 MED ORDER — KETOROLAC TROMETHAMINE 15 MG/ML IJ SOLN
15.0000 mg | Freq: Once | INTRAMUSCULAR | Status: AC
  Administered 2021-06-21: 15 mg via INTRAVENOUS
  Filled 2021-06-21: qty 1

## 2021-06-21 MED ORDER — SODIUM CHLORIDE 0.9 % IV BOLUS
1000.0000 mL | Freq: Once | INTRAVENOUS | Status: AC
  Administered 2021-06-21: 1000 mL via INTRAVENOUS

## 2021-06-21 MED ORDER — MORPHINE SULFATE (PF) 4 MG/ML IV SOLN
4.0000 mg | Freq: Once | INTRAVENOUS | Status: AC
  Administered 2021-06-21: 4 mg via INTRAVENOUS
  Filled 2021-06-21: qty 1

## 2021-06-21 MED ORDER — IOHEXOL 300 MG/ML  SOLN
100.0000 mL | Freq: Once | INTRAMUSCULAR | Status: AC | PRN
  Administered 2021-06-21: 100 mL via INTRAVENOUS
  Filled 2021-06-21: qty 100

## 2021-06-21 MED ORDER — CIPROFLOXACIN HCL 500 MG PO TABS: 500.0000 mg | ORAL_TABLET | Freq: Two times a day (BID) | ORAL | 0 refills | Status: AC

## 2021-06-21 MED ORDER — ONDANSETRON HCL 4 MG/2ML IJ SOLN
4.0000 mg | Freq: Once | INTRAMUSCULAR | Status: AC
  Administered 2021-06-21: 4 mg via INTRAVENOUS
  Filled 2021-06-21: qty 2

## 2021-06-21 MED ORDER — OXYCODONE HCL 5 MG PO TABS: 5.0000 mg | ORAL_TABLET | Freq: Three times a day (TID) | ORAL | 0 refills | Status: AC | PRN

## 2021-06-21 MED ORDER — METRONIDAZOLE 500 MG PO TABS
500.0000 mg | ORAL_TABLET | Freq: Once | ORAL | Status: AC
  Administered 2021-06-21: 500 mg via ORAL
  Filled 2021-06-21: qty 1

## 2021-06-21 MED ORDER — CIPROFLOXACIN HCL 500 MG PO TABS
500.0000 mg | ORAL_TABLET | Freq: Once | ORAL | Status: AC
  Administered 2021-06-21: 500 mg via ORAL
  Filled 2021-06-21: qty 1

## 2021-06-21 NOTE — Discharge Instructions (Addendum)
Please take the 2 antibiotics twice a day for the next 7 days.  If you are having fevers worsening pain or cannot eat or drink please return to the emergency department.  You should take 400 mg of ibuprofen every 6 hours for pain and you can take the oxycodone as needed for pain. ?

## 2021-06-21 NOTE — ED Triage Notes (Signed)
Patient to ER via POV with complaints of LLQ pain that started Wednesday night. States she was also vomiting yesterday. Denies diarrhea. Last BM yesterday.  ? ?Hx of diverticulitis.  ?

## 2021-06-21 NOTE — ED Notes (Signed)
See triage note  presents from [facility] with left sided abd pain and lower back pain  states she did have some fever yesterday and some vomiting  afebrile on arrival  ?

## 2021-06-21 NOTE — ED Provider Notes (Signed)
? ?[facility] ?Provider Note ? ? ? Event Date/Time  ? First MD Initiated Contact with Patient 06/21/21 1233   ?  (approximate) ? ? ?History  ? ?Abdominal Pain ? ? ?HPI ? ?Rebekah Myers is a 66 y.o. female with past medical history of fibromyalgia, hyperlipidemia history of bladder cancer status postchemotherapy and surgery who presents with left lower quadrant abdominal pain.  Symptoms started on Wednesday night, 2 days ago.  Started in the left lower quadrant and pain is still primarily in that location but has secondarily generalized.  She had 2 episodes of emesis last night and is starting to have diarrhea that is nonbloody this morning.  Temp was 100.2 last night she took Tylenol for this.  Has had 2 episodes of diverticulitis in the past last was 4 years ago.  Feels like this feels similar.  Denies urinary symptoms. ?  ? ?Past Medical History:  ?Diagnosis Date  ? Arthritis   ? hands  ? Bladder cancer [facility]) 2008  ? surgery and 6 weeks of chemo tx  ? COVID-19 05/05/2020  ? Home test.  Fatigue and cough.  Resolved  ? Diverticulosis 07/19/2014  ? Fibromyalgia 2014  ? History of colon polyps   ? Hyperlipidemia 06/28/2009  ? mixed  ? ? ?Patient Active Problem List  ? Diagnosis Date Noted  ? Upper back pain 06/24/2019  ? Hx of bladder cancer 01/22/2018  ? Obesity (BMI 30.0-34.9) 01/22/2018  ? Fibromyalgia 05/26/2016  ? HLD (hyperlipidemia) 05/26/2016  ? DD (diverticular disease) 10/23/2014  ? ? ? ?Physical Exam  ?Triage Vital Signs: ?ED Triage Vitals  ?Enc Vitals Group  ?   BP 06/21/21 1215 140/63  ?   Pulse Rate 06/21/21 1212 (!) 118  ?   Resp 06/21/21 1212 18  ?   Temp 06/21/21 1212 (!) 97.5 ?F (36.4 ?C)  ?   Temp Source 06/21/21 1212 Oral  ?   SpO2 06/21/21 1212 96 %  ?   Weight 06/21/21 1213 182 lb (82.6 kg)  ?   Height 06/21/21 1213 '5\' 6"'$  (1.676 m)  ?   Head Circumference --   ?   Peak Flow --   ?   Pain Score 06/21/21 1213 7  ?   Pain Loc --   ?   Pain Edu? --   ?   Excl. in [place]? --    ? ? ?Most recent vital signs: ?Vitals:  ? 06/21/21 1438 06/21/21 1617  ?BP: (!) 102/57 120/62  ?Pulse: (!) 102 (!) 107  ?Resp: 16 16  ?Temp:    ?SpO2: 93% 93%  ? ? ? ?General: Awake, patient appears uncomfortable ?CV:  Good peripheral perfusion.  ?Resp:  Normal effort.  ?Abd:  No distention.  Abdomen is soft but she is tender in the left lower quadrant without guarding ?Neuro:             Awake, Alert, Oriented x 3  ?Other:   ? ? ?ED Results / Procedures / Treatments  ?Labs ?(all labs ordered are listed, but only abnormal results are displayed) ?Labs Reviewed  ?COMPREHENSIVE METABOLIC PANEL - Abnormal; Notable for the following components:  ?    Result Value  ? Glucose, Bld 181 (*)   ? Total Protein 8.2 (*)   ? All other components within normal limits  ?CBC - Abnormal; Notable for the following components:  ? WBC 12.7 (*)   ? All other components within normal limits  ?LIPASE, BLOOD  ?  URINALYSIS, ROUTINE W REFLEX MICROSCOPIC  ? ? ? ?EKG ? ? ? ? ?RADIOLOGY ?Reviewed the patient's CT scan of the abdomen which shows acute uncomplicated diverticulitis as well as a renal mass ? ? ?PROCEDURES: ? ?Critical Care performed: No ? ?Procedures ? ? ? ? ?MEDICATIONS ORDERED IN ED: ?Medications  ?sodium chloride 0.9 % bolus 1,000 mL (0 mLs Intravenous Stopped 06/21/21 1605)  ?morphine (PF) 4 MG/ML injection 4 mg (4 mg Intravenous Given 06/21/21 1432)  ?ondansetron (ZOFRAN) injection 4 mg (4 mg Intravenous Given 06/21/21 1431)  ?iohexol (OMNIPAQUE) 300 MG/ML solution 100 mL (100 mLs Intravenous Contrast Given 06/21/21 1447)  ?ciprofloxacin (CIPRO) tablet 500 mg (500 mg Oral Given 06/21/21 1614)  ?metroNIDAZOLE (FLAGYL) tablet 500 mg (500 mg Oral Given 06/21/21 1614)  ?ketorolac (TORADOL) 15 MG/ML injection 15 mg (15 mg Intravenous Given 06/21/21 1613)  ?sodium chloride 0.9 % bolus 1,000 mL (1,000 mLs Intravenous New Bag/Given 06/21/21 1612)  ? ? ? ?IMPRESSION / MDM / ASSESSMENT AND PLAN / ED COURSE  ?I reviewed the triage vital signs and  the nursing notes. ?             ?               ? ?Differential diagnosis includes, but is not limited to, diverticulitis, diverticular abscess, perforation, bowel obstruction, colitis ? ?The patient is a 66 year old female presenting with left lower quadrant pain that is now become more diffuse.  She is also having diarrhea and 2 episodes of emesis yesterday with a temp of 100.2.  She is mildly tachycardic but looks nontoxic on exam.  She is tender in the left lower quadrant although abdomen is benign.  Labs are notable for leukocytosis of 12.  I am concerned for diverticulitis with complication.  She was given fluid bolus morphine and Zofran.  CT abdomen pelvis shows acute uncomplicated diverticulitis as well as a renal mass that has been stable from prior exams.  On repeat evaluation patient's pain is much more controlled although still having some pain.  She was given another fluid bolus and a dose of Toradol as well as p.o. Cipro and Flagyl as she has allergy to penicillins.  Patient tolerated oral antibiotics well and after Toradol feeling improved thinks that she will be able to manage this at home.  Will prescribe 7-day course of Cipro Flagyl and recommended NSAIDs as well as will prescribe a short course of oxycodone for breakthrough pain.  We discussed return precautions she is appropriate for discharge. ? ?  ? ? ?FINAL CLINICAL IMPRESSION(S) / ED DIAGNOSES  ? ?Final diagnoses:  ?Diverticulitis  ?Renal mass  ? ? ? ?Rx / DC Orders  ? ?ED Discharge Orders   ? ?      Ordered  ?  ciprofloxacin (CIPRO) 500 MG tablet  2 times daily       ? 06/21/21 1703  ?  metroNIDAZOLE (FLAGYL) 500 MG tablet  3 times daily       ? 06/21/21 1703  ?  oxyCODONE (ROXICODONE) 5 MG immediate release tablet  Every 8 hours PRN       ? 06/21/21 1703  ? ?  ?  ? ?  ? ? ? ?Note:  This document was prepared using Dragon voice recognition software and may include unintentional dictation errors. ?  ?Rada Hay, MD ?06/21/21 1706 ? ?

## 2021-10-19 ENCOUNTER — Other Ambulatory Visit: Payer: Self-pay

## 2021-10-19 ENCOUNTER — Ambulatory Visit
Admission: EM | Admit: 2021-10-19 | Discharge: 2021-10-19 | Disposition: A | Discharge status: Home / Self Care | Payer: No Typology Code available for payment source | Attending: Emergency Medicine | Admitting: Emergency Medicine

## 2021-10-19 ENCOUNTER — Encounter: Payer: Self-pay | Admitting: Emergency Medicine

## 2021-10-19 DIAGNOSIS — J209 Acute bronchitis, unspecified: Secondary | ICD-10-CM

## 2021-10-19 MED ORDER — PREDNISONE 10 MG PO TABS: 40.0000 mg | ORAL_TABLET | Freq: Every day | ORAL | 0 refills | Status: AC

## 2021-10-19 MED ORDER — ALBUTEROL SULFATE HFA 108 (90 BASE) MCG/ACT IN AERS: 1.0000 | INHALATION_SPRAY | Freq: Four times a day (QID) | RESPIRATORY_TRACT | 0 refills | Status: DC | PRN

## 2021-10-19 MED ORDER — AZITHROMYCIN 250 MG PO TABS: 250.0000 mg | ORAL_TABLET | Freq: Every day | ORAL | 0 refills | Status: DC

## 2021-10-19 NOTE — Discharge Instructions (Addendum)
Take the prednisone and Zithromax as directed.  Use the albuterol inhaler as directed.  Follow up with your primary care provider next week.

## 2021-10-19 NOTE — ED Triage Notes (Addendum)
Patient c/o productive cough w/ " yellow" sputum x 2 week.   Patient denies fever.   Patient endorses chest congestion. Patient endorses SOB upon exertion.   History of Pneumonia.   Patient took an OTC "cough medicine" and Corticidin with no relief of symptoms.

## 2021-10-19 NOTE — ED Provider Notes (Signed)
[facility]    CSN: [id number] Arrival date & time: 10/19/21  6553      History   Chief Complaint Chief Complaint  Patient presents with   Cough    HPI Rebekah Myers is a 66 y.o. female.  Patient presents with congestion and cough x 2 weeks.  She reports shortness of breath with exertion.  No fever, chills, earache, sore throat, chest pain, vomiting, diarrhea, or other symptoms.  Treatment at home with OTC cough syrup and Coricidin.  She reports history of pneumonia 3 years ago.  Her medical history includes fibromyalgia, diverticulosis, bladder cancer, hyperlipidemia.  Former smoker; quit in 2021.    The history is provided by the patient and medical records.    Past Medical History:  Diagnosis Date   Bladder cancer [facility]) 2008   surgery and 6 weeks of chemo tx   COVID-19 05/05/2020   Home test.  Fatigue and cough.  Resolved   Diverticulosis 07/19/2014   History of colon polyps    Hyperlipidemia 06/28/2009   mixed    Patient Active Problem List   Diagnosis Date Noted   Upper back pain 06/24/2019   Hx of bladder cancer 01/22/2018   Obesity (BMI 30.0-34.9) 01/22/2018   Fibromyalgia 05/26/2016   HLD (hyperlipidemia) 05/26/2016   DD (diverticular disease) 10/23/2014    Past Surgical History:  Procedure Laterality Date   [place] cancer  (2007) 2008   as stated [place]rology in [facility]. BCG treatments weekly x21 followed every year.   DILATATION & CURETTAGE/HYSTEROSCOPY WITH MYOSURE N/A 12/04/2014   Procedure: DILATATION & CURETTAGE/HYSTEROSCOPY WITH MYOSURE;  Surgeon: Boykin Nearing, MD;  Location: [facility] ORS;  Service: Gynecology;  Laterality: N/A;   Right Toe fracture Right 2009   Right First Toe surgery secondary to fracture    OB History     Gravida  1   Para  1   Term      Preterm      AB      Living         SAB      IAB      Ectopic      Multiple      Live Births               Home Medications    Prior to  Admission medications   Medication Sig Start Date End Date Taking? Authorizing Provider  albuterol (VENTOLIN HFA) 108 (90 Base) MCG/ACT inhaler Inhale 1-2 puffs into the lungs every 6 (six) hours as needed for wheezing or shortness of breath. 10/19/21  Yes Sharion Balloon, NP  azithromycin (ZITHROMAX) 250 MG tablet Take 1 tablet (250 mg total) by mouth daily. Take first 2 tablets together, then 1 every day until finished. 10/19/21  Yes Sharion Balloon, NP  predniSONE (DELTASONE) 10 MG tablet Take 4 tablets (40 mg total) by mouth daily for 5 days. 10/19/21 10/24/21 Yes Sharion Balloon, NP  polyethylene glycol-electrolytes (NULYTELY) 420 g solution Prepare according to package instructions. Starting at 5:00 PM: Drink one 8 oz glass of mixture every 15 minutes until you finish half of the jug. Five hours prior to procedure, drink 8 oz glass of mixture every 15 minutes until it is all gone. Make sure you do not drink anything 4 hours prior to your procedure. 05/14/20   Virgel Manifold, MD    Family History Family History  Problem Relation Age of Onset   Depression Mother  Breast cancer Mother 74   Diabetes Father    Gout Father    Melanoma Father    Hyperlipidemia Sister    Hypertension Sister    Aneurysm Maternal Grandmother    Breast cancer Paternal Grandmother    Heart attack Paternal Grandfather     Social History Social History   Tobacco Use   Smoking status: Former    Packs/day: 0.75    Years: 44.00    Total pack years: 33.00    Types: Cigarettes    Quit date: 06/14/2019    Years since quitting: 2.3   Smokeless tobacco: Never  Vaping Use   Vaping Use: Never used  Substance Use Topics   Alcohol use: No   Drug use: No     Allergies   Penicillins, Sulfa antibiotics, and Latex   Review of Systems Review of Systems  Constitutional:  Negative for chills and fever.  HENT:  Positive for congestion. Negative for ear pain and sore throat.   Respiratory:  Positive for cough and  shortness of breath.   Cardiovascular:  Negative for chest pain and palpitations.  Gastrointestinal:  Negative for diarrhea and vomiting.  Skin:  Negative for color change and rash.  All other systems reviewed and are negative.    Physical Exam Triage Vital Signs ED Triage Vitals  Enc Vitals Group     BP      Pulse      Resp      Temp      Temp src      SpO2      Weight      Height      Head Circumference      Peak Flow      Pain Score      Pain Loc      Pain Edu?      Excl. in [place]?    No data found.  Updated Vital Signs BP 127/86 (BP Location: Right Arm)   Pulse 96   Temp 98.2 F (36.8 C)   Resp 18   SpO2 95%   Visual Acuity Right Eye Distance:   Left Eye Distance:   Bilateral Distance:    Right Eye Near:   Left Eye Near:    Bilateral Near:     Physical Exam Vitals and nursing note reviewed.  Constitutional:      General: She is not in acute distress.    Appearance: She is well-developed. She is not ill-appearing.  HENT:     Right Ear: Tympanic membrane normal.     Left Ear: Tympanic membrane normal.     Nose: Nose normal.     Mouth/Throat:     Mouth: Mucous membranes are moist.     Pharynx: Oropharynx is clear.  Cardiovascular:     Rate and Rhythm: Normal rate and regular rhythm.     Heart sounds: Normal heart sounds.  Pulmonary:     Effort: Pulmonary effort is normal. No respiratory distress.     Breath sounds: Normal breath sounds.  Musculoskeletal:     Cervical back: Neck supple.  Skin:    General: Skin is warm and dry.  Neurological:     Mental Status: She is alert.  Psychiatric:        Mood and Affect: Mood normal.        Behavior: Behavior normal.      UC Treatments / Results  Labs (all labs ordered are listed, but only abnormal results are displayed) Labs Reviewed -  No data to display  EKG   Radiology No results found.  Procedures Procedures (including critical care time)  Medications Ordered in UC Medications - No  data to display  Initial Impression / Assessment and Plan / UC Course  I have reviewed the triage vital signs and the nursing notes.  Pertinent labs & imaging results that were available during my care of the patient were reviewed by me and considered in my medical decision making (see chart for details).  Acute bronchitis.  Patient declines CXR.  Lungs are clear and O2 sat is 95% on room air.  She is a former smoker and reports history of pneumonia 3 years ago.  Treating today with albuterol inhaler, prednisone, Zithromax.  Instructed her to follow-up with her PCP next week.  Education provided on bronchitis.  Patient agrees to plan of care.  Final Clinical Impressions(s) / UC Diagnoses   Final diagnoses:  Acute bronchitis, unspecified organism     Discharge Instructions      Take the prednisone and Zithromax as directed.  Use the albuterol inhaler as directed.  Follow up with your primary care provider next week.        ED Prescriptions     Medication Sig Dispense Auth. Provider   albuterol (VENTOLIN HFA) 108 (90 Base) MCG/ACT inhaler Inhale 1-2 puffs into the lungs every 6 (six) hours as needed for wheezing or shortness of breath. 18 g Sharion Balloon, NP   predniSONE (DELTASONE) 10 MG tablet Take 4 tablets (40 mg total) by mouth daily for 5 days. 20 tablet Sharion Balloon, NP   azithromycin (ZITHROMAX) 250 MG tablet Take 1 tablet (250 mg total) by mouth daily. Take first 2 tablets together, then 1 every day until finished. 6 tablet Sharion Balloon, NP      PDMP not reviewed this encounter.   Sharion Balloon, NP 10/19/21 [phone]

## 2021-11-11 ENCOUNTER — Other Ambulatory Visit: Payer: Self-pay | Admitting: Internal Medicine

## 2021-11-11 DIAGNOSIS — Z1231 Encounter for screening mammogram for malignant neoplasm of breast: Secondary | ICD-10-CM

## 2021-11-13 ENCOUNTER — Encounter: Payer: Self-pay | Admitting: Internal Medicine

## 2021-11-13 ENCOUNTER — Telehealth: Payer: Self-pay

## 2021-11-13 ENCOUNTER — Other Ambulatory Visit: Payer: Self-pay

## 2021-11-13 DIAGNOSIS — Z86010 Personal history of colonic polyps: Secondary | ICD-10-CM

## 2021-11-13 NOTE — Telephone Encounter (Signed)
Gastroenterology Pre-Procedure Review  Request Date: 01/03/22 Requesting Physician: Dr. Allen Norris  PATIENT REVIEW QUESTIONS: The patient responded to the following health history questions as indicated:    1. Are you having any GI issues? no 2. Do you have a personal history of Polyps  yes [facility] 03/14/2015 3. Do you have a family history of Colon Cancer or Polyps? no 4. Diabetes Mellitus? no 5. Joint replacements in the past 12 months?no 6. Major health problems in the past 3 months?no 7. Any artificial heart valves, MVP, or defibrillator?no    MEDICATIONS & ALLERGIES:    Patient reports the following regarding taking any anticoagulation/antiplatelet therapy:   Plavix, Coumadin, Eliquis, Xarelto, Lovenox, Pradaxa, Brilinta, or Effient? no Aspirin? no  Patient confirms/reports the following medications:  Current Outpatient Medications  Medication Sig Dispense Refill   albuterol (VENTOLIN HFA) 108 (90 Base) MCG/ACT inhaler Inhale 1-2 puffs into the lungs every 6 (six) hours as needed for wheezing or shortness of breath. 18 g 0   azithromycin (ZITHROMAX) 250 MG tablet Take 1 tablet (250 mg total) by mouth daily. Take first 2 tablets together, then 1 every day until finished. 6 tablet 0   polyethylene glycol-electrolytes (NULYTELY) 420 g solution Prepare according to package instructions. Starting at 5:00 PM: Drink one 8 oz glass of mixture every 15 minutes until you finish half of the jug. Five hours prior to procedure, drink 8 oz glass of mixture every 15 minutes until it is all gone. Make sure you do not drink anything 4 hours prior to your procedure. 4000 mL 0   No current facility-administered medications for this visit.    Patient confirms/reports the following allergies:  Allergies  Allergen Reactions   Penicillins Swelling    Has patient had a PCN reaction causing immediate rash, facial/tongue/throat swelling, SOB or lightheadedness with hypotension: Yes Has patient had  a PCN reaction causing severe rash involving mucus membranes or skin necrosis: No Has patient had a PCN reaction that required hospitalization No Has patient had a PCN reaction occurring within the last 10 years: No If all of the above answers are "NO", then may proceed with Cephalosporin use.    Sulfa Antibiotics Hives   Latex Itching and Rash    Latex gloves (when worn).  Bandaids    No orders of the defined types were placed in this encounter.   AUTHORIZATION INFORMATION Primary Insurance: 1D#: Group #:  Secondary Insurance: 1D#: Group #:  SCHEDULE INFORMATION: Date: 01/03/22 Time: Location: [place]

## 2021-12-19 ENCOUNTER — Ambulatory Visit: Payer: Self-pay | Admitting: Cardiology

## 2021-12-19 DIAGNOSIS — R079 Chest pain, unspecified: Secondary | ICD-10-CM | POA: Insufficient documentation

## 2021-12-19 MED ORDER — SODIUM CHLORIDE 0.9% FLUSH: 3.0000 mL | Freq: Two times a day (BID) | INTRAVENOUS | Status: DC

## 2021-12-20 ENCOUNTER — Encounter: Payer: Self-pay | Admitting: Cardiovascular Disease

## 2021-12-20 ENCOUNTER — Encounter
Admission: RE | Disposition: A | Discharge status: Home / Self Care | Payer: Self-pay | Source: Home / Self Care | Attending: Cardiovascular Disease

## 2021-12-20 ENCOUNTER — Ambulatory Visit
Admission: RE | Admit: 2021-12-20 | Discharge: 2021-12-20 | Disposition: A | Discharge status: Home / Self Care | Payer: No Typology Code available for payment source | Attending: Cardiovascular Disease | Admitting: Cardiovascular Disease

## 2021-12-20 DIAGNOSIS — I2510 Atherosclerotic heart disease of native coronary artery without angina pectoris: Secondary | ICD-10-CM | POA: Diagnosis not present

## 2021-12-20 DIAGNOSIS — K219 Gastro-esophageal reflux disease without esophagitis: Secondary | ICD-10-CM | POA: Insufficient documentation

## 2021-12-20 DIAGNOSIS — R079 Chest pain, unspecified: Secondary | ICD-10-CM | POA: Insufficient documentation

## 2021-12-20 HISTORY — PX: LEFT HEART CATH AND CORONARY ANGIOGRAPHY: CATH118249

## 2021-12-20 LAB — PROTIME-INR
INR: 1.1 (ref 0.8–1.2)
Prothrombin Time: 14 seconds (ref 11.4–15.2)

## 2021-12-20 LAB — BASIC METABOLIC PANEL
Anion gap: 6 (ref 5–15)
BUN: 13 mg/dL (ref 8–23)
CO2: 26 mmol/L (ref 22–32)
Calcium: 9 mg/dL (ref 8.9–10.3)
Chloride: 110 mmol/L (ref 98–111)
Creatinine, Ser: 0.64 mg/dL (ref 0.44–1.00)
GFR, Estimated: >60 mL/min (ref >60)
Glucose, Bld: 95 mg/dL (ref 70–99)
Potassium: 3.8 mmol/L (ref 3.5–5.1)
Sodium: 142 mmol/L (ref 135–145)

## 2021-12-20 LAB — CBC
HCT: 41.1 % (ref 36.0–46.0)
Hemoglobin: 13.7 g/dL (ref 12.0–15.0)
MCH: 29.5 pg (ref 26.0–34.0)
MCHC: 33.3 g/dL (ref 30.0–36.0)
MCV: 88.6 fL (ref 80.0–100.0)
Platelets: 199 10*3/uL (ref 150–400)
RBC: 4.64 MIL/uL (ref 3.87–5.11)
RDW: 12.8 % (ref 11.5–15.5)
WBC: 6.5 10*3/uL (ref 4.0–10.5)
nRBC: 0 % (ref 0.0–0.2)

## 2021-12-20 SURGERY — LEFT HEART CATH AND CORONARY ANGIOGRAPHY
Anesthesia: Moderate Sedation | Laterality: Left

## 2021-12-20 MED ORDER — VERAPAMIL HCL 2.5 MG/ML IV SOLN
INTRAVENOUS | Status: DC | PRN
  Administered 2021-12-20: 2.5 mg via INTRA_ARTERIAL

## 2021-12-20 MED ORDER — MIDAZOLAM HCL 2 MG/2ML IJ SOLN
INTRAMUSCULAR | Status: DC | PRN
  Administered 2021-12-20: 1 mg via INTRAVENOUS

## 2021-12-20 MED ORDER — SODIUM CHLORIDE 0.9% FLUSH: 3.0000 mL | INTRAVENOUS | Status: DC | PRN

## 2021-12-20 MED ORDER — MIDAZOLAM HCL 2 MG/2ML IJ SOLN
INTRAMUSCULAR | Status: AC
  Filled 2021-12-20: qty 2

## 2021-12-20 MED ORDER — ASPIRIN 81 MG PO CHEW
CHEWABLE_TABLET | ORAL | Status: AC
  Administered 2021-12-20: 81 mg via ORAL
  Filled 2021-12-20: qty 1

## 2021-12-20 MED ORDER — HEPARIN (PORCINE) IN NACL 1000-0.9 UT/500ML-% IV SOLN
INTRAVENOUS | Status: AC
  Filled 2021-12-20: qty 1000

## 2021-12-20 MED ORDER — ALUM & MAG HYDROXIDE-SIMETH 200-200-20 MG/5ML PO SUSP: 30.0000 mL | Freq: Once | ORAL | Status: AC

## 2021-12-20 MED ORDER — ASPIRIN 81 MG PO CHEW: 81.0000 mg | CHEWABLE_TABLET | ORAL | Status: AC

## 2021-12-20 MED ORDER — SODIUM CHLORIDE 0.9 % IV SOLN: 250.0000 mL | INTRAVENOUS | Status: DC | PRN

## 2021-12-20 MED ORDER — VERAPAMIL HCL 2.5 MG/ML IV SOLN
INTRAVENOUS | Status: AC
  Filled 2021-12-20: qty 2

## 2021-12-20 MED ORDER — FENTANYL CITRATE (PF) 100 MCG/2ML IJ SOLN
INTRAMUSCULAR | Status: DC | PRN
  Administered 2021-12-20: 50 ug via INTRAVENOUS

## 2021-12-20 MED ORDER — SODIUM CHLORIDE 0.9 % WEIGHT BASED INFUSION: 1.0000 mL/kg/h | INTRAVENOUS | Status: DC

## 2021-12-20 MED ORDER — HEPARIN SODIUM (PORCINE) 1000 UNIT/ML IJ SOLN
INTRAMUSCULAR | Status: DC | PRN
  Administered 2021-12-20: 4000 [IU] via INTRAVENOUS

## 2021-12-20 MED ORDER — LIDOCAINE HCL (PF) 1 % IJ SOLN
INTRAMUSCULAR | Status: DC | PRN
  Administered 2021-12-20: 5 mL

## 2021-12-20 MED ORDER — ACETAMINOPHEN 325 MG PO TABS: 650.0000 mg | ORAL_TABLET | ORAL | Status: DC | PRN

## 2021-12-20 MED ORDER — FENTANYL CITRATE (PF) 100 MCG/2ML IJ SOLN
INTRAMUSCULAR | Status: AC
  Filled 2021-12-20: qty 2

## 2021-12-20 MED ORDER — SODIUM CHLORIDE 0.9 % WEIGHT BASED INFUSION
3.0000 mL/kg/h | INTRAVENOUS | Status: DC
  Administered 2021-12-20: 3 mL/kg/h via INTRAVENOUS

## 2021-12-20 MED ORDER — HYDRALAZINE HCL 20 MG/ML IJ SOLN: 10.0000 mg | INTRAMUSCULAR | Status: DC | PRN

## 2021-12-20 MED ORDER — IOHEXOL 300 MG/ML  SOLN
INTRAMUSCULAR | Status: DC | PRN
  Administered 2021-12-20: 50 mL

## 2021-12-20 MED ORDER — HEPARIN (PORCINE) IN NACL 2000-0.9 UNIT/L-% IV SOLN
INTRAVENOUS | Status: DC | PRN
  Administered 2021-12-20: 1000 mL

## 2021-12-20 MED ORDER — SODIUM CHLORIDE 0.9% FLUSH: 3.0000 mL | Freq: Two times a day (BID) | INTRAVENOUS | Status: DC

## 2021-12-20 MED ORDER — LIDOCAINE HCL 1 % IJ SOLN
INTRAMUSCULAR | Status: AC
  Filled 2021-12-20: qty 20

## 2021-12-20 MED ORDER — HEPARIN SODIUM (PORCINE) 1000 UNIT/ML IJ SOLN
INTRAMUSCULAR | Status: AC
  Filled 2021-12-20: qty 10

## 2021-12-20 MED ORDER — ALUM & MAG HYDROXIDE-SIMETH 200-200-20 MG/5ML PO SUSP
ORAL | Status: AC
  Administered 2021-12-20: 30 mL via ORAL
  Filled 2021-12-20: qty 30

## 2021-12-20 MED ORDER — ONDANSETRON HCL 4 MG/2ML IJ SOLN: 4.0000 mg | Freq: Four times a day (QID) | INTRAMUSCULAR | Status: DC | PRN

## 2021-12-20 MED ORDER — LABETALOL HCL 5 MG/ML IV SOLN: 10.0000 mg | INTRAVENOUS | Status: DC | PRN

## 2021-12-20 SURGICAL SUPPLY — 12 items
BAND CMPR LRG ZPHR (HEMOSTASIS) ×1
BAND ZEPHYR COMPRESS 30 LONG (HEMOSTASIS) IMPLANT
CATH 5FR JL3.5 JR4 ANG PIG MP (CATHETERS) IMPLANT
DRAPE BRACHIAL (DRAPES) IMPLANT
GLIDESHEATH SLEND SS 6F .021 (SHEATH) IMPLANT
GUIDEWIRE INQWIRE 1.5J.035X260 (WIRE) IMPLANT
INQWIRE 1.5J .035X260CM (WIRE) ×1
KIT SYRINGE INJ CVI SPIKEX1 (MISCELLANEOUS) IMPLANT
PACK CARDIAC CATH (CUSTOM PROCEDURE TRAY) ×2 IMPLANT
PROTECTION STATION PRESSURIZED (MISCELLANEOUS) ×1
SET ATX SIMPLICITY (MISCELLANEOUS) IMPLANT
STATION PROTECTION PRESSURIZED (MISCELLANEOUS) IMPLANT

## 2022-01-31 HISTORY — DX: Zoster without complications: B02.9

## 2022-02-07 ENCOUNTER — Ambulatory Visit: Payer: No Typology Code available for payment source | Admitting: Dietician

## 2022-02-17 ENCOUNTER — Encounter: Payer: Self-pay | Admitting: Gastroenterology

## 2022-02-18 ENCOUNTER — Encounter: Payer: Self-pay | Admitting: Anesthesiology

## 2022-04-14 ENCOUNTER — Ambulatory Visit
Admission: RE | Admit: 2022-04-14 | Payer: No Typology Code available for payment source | Source: Home / Self Care | Admitting: Gastroenterology

## 2022-04-14 SURGERY — COLONOSCOPY WITH PROPOFOL
Anesthesia: General

## 2022-10-08 ENCOUNTER — Telehealth: Payer: Self-pay | Admitting: Internal Medicine

## 2022-10-08 NOTE — Telephone Encounter (Signed)
Patient came by and wanted to know if Dr. Reece Agar would take her on as a new patient. She stated that her husband [name] Perfetti currently sees him and would like to establish with him. Please advise. Thank you!

## 2022-10-08 NOTE — Telephone Encounter (Signed)
Ok to establish care with me. Thanks  

## 2022-10-10 NOTE — Telephone Encounter (Signed)
Spoke to pt, scheduled ov for 7/23

## 2022-10-28 ENCOUNTER — Ambulatory Visit (INDEPENDENT_AMBULATORY_CARE_PROVIDER_SITE_OTHER): Payer: No Typology Code available for payment source | Admitting: Family Medicine

## 2022-10-28 ENCOUNTER — Encounter: Payer: Self-pay | Admitting: Family Medicine

## 2022-10-28 VITALS — BP 114/70 | HR 80 | Temp 97.6°F | Ht 65.5 in | Wt 192.4 lb

## 2022-10-28 DIAGNOSIS — Z8719 Personal history of other diseases of the digestive system: Secondary | ICD-10-CM | POA: Diagnosis not present

## 2022-10-28 DIAGNOSIS — E66811 Obesity, class 1: Secondary | ICD-10-CM

## 2022-10-28 DIAGNOSIS — I2510 Atherosclerotic heart disease of native coronary artery without angina pectoris: Secondary | ICD-10-CM | POA: Diagnosis not present

## 2022-10-28 DIAGNOSIS — Z87891 Personal history of nicotine dependence: Secondary | ICD-10-CM

## 2022-10-28 DIAGNOSIS — R300 Dysuria: Secondary | ICD-10-CM

## 2022-10-28 DIAGNOSIS — Z8551 Personal history of malignant neoplasm of bladder: Secondary | ICD-10-CM

## 2022-10-28 DIAGNOSIS — R399 Unspecified symptoms and signs involving the genitourinary system: Secondary | ICD-10-CM | POA: Diagnosis not present

## 2022-10-28 DIAGNOSIS — E669 Obesity, unspecified: Secondary | ICD-10-CM

## 2022-10-28 DIAGNOSIS — N289 Disorder of kidney and ureter, unspecified: Secondary | ICD-10-CM | POA: Diagnosis not present

## 2022-10-28 DIAGNOSIS — E782 Mixed hyperlipidemia: Secondary | ICD-10-CM

## 2022-10-28 DIAGNOSIS — Z860100 Personal history of colon polyps, unspecified: Secondary | ICD-10-CM

## 2022-10-28 HISTORY — DX: Atherosclerotic heart disease of native coronary artery without angina pectoris: I25.10

## 2022-10-28 LAB — POC URINALSYSI DIPSTICK (AUTOMATED)
Bilirubin, UA: NEGATIVE
Blood, UA: NEGATIVE
Glucose, UA: NEGATIVE
Ketones, UA: NEGATIVE
Leukocytes, UA: NEGATIVE
Nitrite, UA: NEGATIVE
Protein, UA: NEGATIVE
Spec Grav, UA: >=1.03 — AB (ref 1.010–1.025)
Urobilinogen, UA: 0.2 E.U./dL
pH, UA: 5.5 (ref 5.0–8.0)

## 2022-10-28 NOTE — Patient Instructions (Addendum)
Urinalysis today  We will refer you to urology [facility]) and gastroenterology (Dr Elliot's office) [place] to schedule mammogram at your convenience: [facility] at [facility] [phone] Return as needed or in 3-4 months for physical, prior fasting for labwork.  Nice to see you today!

## 2022-10-28 NOTE — Progress Notes (Signed)
Ph: [phone] Fax: [phone]   Patient ID: Rebekah Myers, female    DOB: 11-19-1955, 67 y.o.   MRN: [id number]  This visit was conducted in person.  BP 114/70   Pulse 80   Temp 97.6 F (36.4 C) (Temporal)   Ht 5' 5.5" (1.664 m)   Wt 192 lb 6 oz (87.3 kg)   SpO2 98%   BMI 31.53 kg/m    CC: new pt to establish Subjective:   HPI: Rebekah Myers is a 67 y.o. female presenting on 10/28/2022 for New Patient (Initial Visit)   I see patient's husband Rebekah Myers.  New patient, previously saw Dr Welton Flakes at [facility]. Records requested.   HLD - has not been on statin.  Recently joint [employer], signed up for weight watchers last week.   H/o diverticulitis s/p hospitalization latest 2022   H/o bladder cancer 13 yrs ago - saw [facility] urologist Dr Othelia Pulling s/p TURBT followed by BCG therapy.  Over the past 3 months noticing recurrent bladder infections - urinary urgency, frequency, dysuria, incomplete emptying. No noted blood in urine.  06/2022 seen at [facility] with UTI symptoms treated with nitrofurantoin 5d course with benefit. Has received 2 treatments. UCx at that time grew E coli resistant to penicillin. Available records reviewed She would like referral to see Dr [facility] urologist in [place].   Ex smoker - quit 04/07/2018. Previously smoked 1/2 ppd for 30 yrs.   H/o L heart catheterization 12/2021:   Mid LAD lesion is 30% stenosed.   The left ventricular systolic function is normal.   LV end diastolic pressure is moderately elevated.   The left ventricular ejection fraction is greater than 65% by visual estimate. Mild irregularities in mid LAD otherwise normal, normal EF, non cardiac chest pain(GERD).  Colonoscopy 2016 [facility]) - diverticulosis, int hem  Colonoscopy 2013 - 1 polyp  Requests return to [facility]  Last physical 2023, last well woman exam with PCP.     Past Medical History:  Diagnosis Date   Arthritis    hands   Bladder cancer (HCC) 2008   surgery and 6 weeks of  chemo tx   CAD (coronary artery disease) 10/28/2022   H/o L heart catheterization 12/2021:    Mid LAD lesion is 30% stenosed.    The left ventricular systolic function is normal.    LV end diastolic pressure is moderately elevated.    The left ventricular ejection fraction is greater than 65% by visual estimate.  Mild irregularities in mid LAD otherwise normal, normal EF, non cardiac chest pain(GERD).     COVID-19 05/05/2020   Home test.  Fatigue and cough.  Resolved.  01/26/22 - Resolved   History of chicken pox    History of colon polyps    History of diverticulitis 07/19/2014   Hyperlipidemia 06/28/2009   mixed    Past Surgical History:  Procedure Laterality Date   DILATATION & CURETTAGE/HYSTEROSCOPY WITH MYOSURE N/A 12/04/2014   Procedure: DILATATION & CURETTAGE/HYSTEROSCOPY WITH MYOSURE;  Surgeon: Suzy Bouchard, MD;  Location: [facility] ORS;  Service: Gynecology;  Laterality: N/A;   LEFT HEART CATH AND CORONARY ANGIOGRAPHY Left 12/20/2021   Procedure: LEFT HEART CATH AND CORONARY ANGIOGRAPHY;  Surgeon: Laurier Nancy, MD;  Location: [facility] INVASIVE CV LAB;  Service: Cardiovascular;  Laterality: Left;   Right Toe fracture Right 2009   Right First Toe surgery secondary to fracture   TRANSURETHRAL RESECTION OF BLADDER TUMOR  2008   as stated [facility]rology  in [facility]. BCG treatments weekly x21 followed every year.    Family History  Problem Relation Age of Onset   Depression Mother    Breast cancer Mother 45   COPD Mother    Diabetes Father    Gout Father    Melanoma Father 48       deceased from melanoma   Hyperlipidemia Sister    Hypertension Sister    Aneurysm Maternal Grandmother        brain aneurysm   Breast cancer Paternal Grandmother    CAD Paternal Grandfather        MI    Social History   Tobacco Use   Smoking status: Former    Current packs/day: 0.00    Average packs/day: 0.8 packs/day for 44.0 years (33.0 ttl pk-yrs)    Types: Cigarettes    Start date:  06/14/1975    Quit date: 06/14/2019    Years since quitting: 3.3   Smokeless tobacco: Never  Vaping Use   Vaping status: Never Used  Substance Use Topics   Alcohol use: No   Drug use: No    Relevant past medical, surgical, family and social history reviewed and updated as indicated. Interim medical history since our last visit reviewed. Allergies and medications reviewed and updated. Outpatient Medications Prior to Visit  Medication Sig Dispense Refill   polyethylene glycol-electrolytes (NULYTELY) 420 g solution Prepare according to package instructions. Starting at 5:00 PM: Drink one 8 oz glass of mixture every 15 minutes until you finish half of the jug. Five hours prior to procedure, drink 8 oz glass of mixture every 15 minutes until it is all gone. Make sure you do not drink anything 4 hours prior to your procedure. 4000 mL 0   valACYclovir (VALTREX) 1000 MG tablet Take 1,000 mg by mouth 3 (three) times daily as needed.     sodium chloride flush (NS) 0.9 % injection 3 mL      No facility-administered medications prior to visit.     Per HPI unless specifically indicated in ROS section below Review of Systems  Objective:  BP 114/70   Pulse 80   Temp 97.6 F (36.4 C) (Temporal)   Ht 5' 5.5" (1.664 m)   Wt 192 lb 6 oz (87.3 kg)   SpO2 98%   BMI 31.53 kg/m   Wt Readings from Last 3 Encounters:  10/28/22 192 lb 6 oz (87.3 kg)  12/20/21 186 lb (84.4 kg)  02/17/22 186 lb (84.4 kg)      Physical Exam Vitals and nursing note reviewed.  Constitutional:      Appearance: Normal appearance. She is not ill-appearing.  HENT:     Mouth/Throat:     Mouth: Mucous membranes are moist.     Pharynx: Oropharynx is clear. No oropharyngeal exudate or posterior oropharyngeal erythema.  Eyes:     Extraocular Movements: Extraocular movements intact.     Pupils: Pupils are equal, round, and reactive to light.  Neck:     Thyroid: No thyroid mass or thyromegaly.  Cardiovascular:     Rate and  Rhythm: Normal rate and regular rhythm.     Pulses: Normal pulses.     Heart sounds: Normal heart sounds. No murmur heard. Pulmonary:     Effort: Pulmonary effort is normal. No respiratory distress.     Breath sounds: Normal breath sounds. No wheezing, rhonchi or rales.  Abdominal:     General: Bowel sounds are normal. There is no distension.  Palpations: Abdomen is soft. There is no mass.     Tenderness: There is no abdominal tenderness. There is no guarding or rebound.     Hernia: No hernia is present.  Musculoskeletal:     Cervical back: Normal range of motion and neck supple.     Right lower leg: No edema.     Left lower leg: No edema.  Skin:    General: Skin is warm and dry.     Findings: No rash.  Neurological:     Mental Status: She is alert.  Psychiatric:        Mood and Affect: Mood normal.        Behavior: Behavior normal.       Results for orders placed or performed in visit on 10/28/22  POCT Urinalysis Dipstick (Automated)  Result Value Ref Range   Color, UA yellow    Clarity, UA clear    Glucose, UA Negative Negative   Bilirubin, UA negative    Ketones, UA negative    Spec Grav, UA >=1.030 (A) 1.010 - 1.025   Blood, UA negative    pH, UA 5.5 5.0 - 8.0   Protein, UA Negative Negative   Urobilinogen, UA 0.2 0.2 or 1.0 E.U./dL   Nitrite, UA negative    Leukocytes, UA Negative Negative    Assessment & Plan:   Problem List Items Addressed This Visit     HLD (hyperlipidemia)    Check lipids next fasting labs.       Hx of bladder cancer    Last saw urology Dr Cleatrice Burke in [place] ~3 yrs ago. Refer locally to Dr Lonna Cobb to establish care.       Relevant Orders   Ambulatory referral to Urology   Obesity (BMI 30.0-34.9)    Encouraged healthy diet and lifestyle choices to affect sustainable weight loss. She is motivated as she recently joined [employer] loss program.       Lower urinary tract symptoms (LUTS)  - Primary    Recurrence of symptoms over the past 3-4 months, s/p 2 courses of Macrobid with benefit.  UA today normal, concentrated. Reviewed avoiding bladder irritants, increasing water intake. UCx sent.  In bladder cancer history, see below.       Relevant Orders   Ambulatory referral to Urology   History of diverticulitis    Reviewed high fiber diet to prevent exacerbation. Reviewed clear liquid diet at onset of symptoms, reviewed need for evaluation if not improved with this.       Ex-smoker    Quit 2020. Previously smoked 1/2 ppd x 30 yrs. With 15 PY hx, may not be eligible for lung cancer screening. Consider lung cancer screening program referral to discuss.       CAD (coronary artery disease)   Kidney lesion, native, left    Reviewed previous imaging - 1.4cm L upper kidney lesion stable from 2015 thought to be proteinaceous cyst. Consider renal US for further evaluation, await uro eval.      Other Visit Diagnoses     History of colon polyps       Relevant Orders   Ambulatory referral to Gastroenterology   Dysuria       Relevant Orders   POCT Urinalysis Dipstick (Automated) (Completed)   Urine Culture        No orders of the defined types were placed in this encounter.   Orders Placed This Encounter  Procedures   Urine Culture  Ambulatory referral to Gastroenterology    Referral Priority:   Routine    Referral Type:   Consultation    Referral Reason:   Specialty Services Required    Number of Visits Requested:   1   Ambulatory referral to Urology    Referral Priority:   Routine    Referral Type:   Consultation    Referral Reason:   Specialty Services Required    Requested Specialty:   Urology    Number of Visits Requested:   1   POCT Urinalysis Dipstick (Automated)    Patient Instructions  Urinalysis today  We will refer you to urology [facility]) and gastroenterology (Dr Elliot's office) [place] to schedule mammogram at your convenience: [facility] at [facility] [phone] Return as needed or in 3-4 months for physical, prior fasting for labwork.  Nice to see you today!  Follow up plan: Return in about 3 months (around 01/28/2023) for annual exam, prior fasting for blood work.  Eustaquio Boyden, MD

## 2022-10-28 NOTE — Assessment & Plan Note (Signed)
Reviewed high fiber diet to prevent exacerbation. Reviewed clear liquid diet at onset of symptoms, reviewed need for evaluation if not improved with this.

## 2022-10-28 NOTE — Assessment & Plan Note (Addendum)
Last saw urology Dr Cleatrice Burke in [place] ~3 yrs ago. Refer locally to Dr Lonna Cobb to establish care.

## 2022-10-28 NOTE — Assessment & Plan Note (Signed)
Check lipids next fasting labs.

## 2022-10-28 NOTE — Assessment & Plan Note (Addendum)
Reviewed previous imaging - 1.4cm L upper kidney lesion stable from 2015 thought to be proteinaceous cyst. Consider renal US for further evaluation, await uro eval.

## 2022-10-28 NOTE — Assessment & Plan Note (Addendum)
Recurrence of symptoms over the past 3-4 months, s/p 2 courses of Macrobid with benefit.  UA today normal, concentrated. Reviewed avoiding bladder irritants, increasing water intake. UCx sent.  In bladder cancer history, see below.

## 2022-10-28 NOTE — Assessment & Plan Note (Addendum)
Quit 2020. Previously smoked 1/2 ppd x 30 yrs. With 15 PY hx, may not be eligible for lung cancer screening. Consider lung cancer screening program referral to discuss.

## 2022-10-28 NOTE — Assessment & Plan Note (Signed)
Encouraged healthy diet and lifestyle choices to affect sustainable weight loss. She is motivated as she recently joined [employer] loss program.

## 2022-10-29 LAB — URINE CULTURE
MICRO NUMBER:: 15235345
SPECIMEN QUALITY:: ADEQUATE

## 2022-10-30 ENCOUNTER — Encounter: Payer: Self-pay | Admitting: *Deleted

## 2022-11-25 ENCOUNTER — Encounter: Payer: Self-pay | Admitting: Emergency Medicine

## 2022-11-25 ENCOUNTER — Ambulatory Visit
Admission: EM | Admit: 2022-11-25 | Discharge: 2022-11-25 | Disposition: A | Discharge status: Home / Self Care | Payer: Medicare Other | Attending: Emergency Medicine | Admitting: Emergency Medicine

## 2022-11-25 DIAGNOSIS — U071 COVID-19: Secondary | ICD-10-CM | POA: Insufficient documentation

## 2022-11-25 DIAGNOSIS — B349 Viral infection, unspecified: Secondary | ICD-10-CM

## 2022-11-25 DIAGNOSIS — R059 Cough, unspecified: Secondary | ICD-10-CM | POA: Diagnosis present

## 2022-11-25 DIAGNOSIS — R519 Headache, unspecified: Secondary | ICD-10-CM | POA: Diagnosis present

## 2022-11-25 LAB — SARS CORONAVIRUS 2 (TAT 6-24 HRS): SARS Coronavirus 2: POSITIVE — AB

## 2022-11-25 NOTE — Discharge Instructions (Addendum)
Your COVID test is pending.    Take Tylenol as needed for fever or discomfort.  Rest and keep yourself hydrated.    Follow-up with your primary care provider if your symptoms are not improving.     

## 2022-11-25 NOTE — ED Triage Notes (Signed)
Pt presents with a cough, congestion, headache and bodyaches x 3 days

## 2022-11-25 NOTE — ED Provider Notes (Signed)
Rebekah Myers    CSN: [id number] Arrival date & time: 11/25/22  0801      History   Chief Complaint Chief Complaint  Patient presents with   Headache   Nasal Congestion   Generalized Body Aches    HPI Rebekah Myers is a 67 y.o. female.  Patient presents with 3-day history of congestion, cough, body aches, headache.  Treating with Excedrin.  She denies fever, shortness of breath, chest pain, or other symptoms.  Her medical history includes hyperlipidemia, CAD, obesity, diverticulitis, bladder cancer.  The history is provided by the patient and medical records.    Past Medical History:  Diagnosis Date   Arthritis    hands   Bladder cancer (HCC) 2008   surgery and 6 weeks of chemo tx   CAD (coronary artery disease) 10/28/2022   H/o L heart catheterization 12/2021:    Mid LAD lesion is 30% stenosed.    The left ventricular systolic function is normal.    LV end diastolic pressure is moderately elevated.    The left ventricular ejection fraction is greater than 65% by visual estimate.  Mild irregularities in mid LAD otherwise normal, normal EF, non cardiac chest pain(GERD).     COVID-19 05/05/2020   Home test.  Fatigue and cough.  Resolved.  01/26/22 - Resolved   History of chicken pox    History of colon polyps    History of diverticulitis 07/19/2014   Hyperlipidemia 06/28/2009   mixed    Patient Active Problem List   Diagnosis Date Noted   Lower urinary tract symptoms (LUTS) 10/28/2022   History of diverticulitis 10/28/2022   Ex-smoker 10/28/2022   CAD (coronary artery disease) 10/28/2022   Kidney lesion, native, left 10/28/2022   Obesity (BMI 30.0-34.9) 01/22/2018   Fibromyalgia 05/26/2016   HLD (hyperlipidemia) 05/26/2016   Hx of bladder cancer 2008    Past Surgical History:  Procedure Laterality Date   COLONOSCOPY  2013   1 polyp [facility])   COLONOSCOPY  2016   diverticulosis, int hem [facility])   DILATATION & CURETTAGE/HYSTEROSCOPY WITH MYOSURE N/A  12/04/2014   Procedure: DILATATION & CURETTAGE/HYSTEROSCOPY WITH MYOSURE;  Surgeon: Suzy Bouchard, MD;  Location: [facility] ORS;  Service: Gynecology;  Laterality: N/A;   LEFT HEART CATH AND CORONARY ANGIOGRAPHY Left 12/20/2021   Procedure: LEFT HEART CATH AND CORONARY ANGIOGRAPHY;  Surgeon: Laurier Nancy, MD;  Location: [facility] INVASIVE CV LAB;  Service: Cardiovascular;  Laterality: Left;   Right Toe fracture Right 2009   Right First Toe surgery secondary to fracture   TRANSURETHRAL RESECTION OF BLADDER TUMOR  2008   as stated [facility]rology in [facility]. BCG treatments weekly x21 followed every year.    OB History     Gravida  1   Para  1   Term      Preterm      AB      Living         SAB      IAB      Ectopic      Multiple      Live Births               Home Medications    Prior to Admission medications   Not on File    Family History Family History  Problem Relation Age of Onset   Depression Mother    Breast cancer Mother 16   COPD Mother    Diabetes Father    Gout  Father    Melanoma Father 50       deceased from melanoma   Hyperlipidemia Sister    Hypertension Sister    Aneurysm Maternal Grandmother        brain aneurysm   Breast cancer Paternal Grandmother    CAD Paternal Grandfather        MI    Social History Social History   Tobacco Use   Smoking status: Former    Current packs/day: 0.00    Average packs/day: 0.8 packs/day for 44.0 years (33.0 ttl pk-yrs)    Types: Cigarettes    Start date: 06/14/1975    Quit date: 06/14/2019    Years since quitting: 3.4   Smokeless tobacco: Never  Vaping Use   Vaping status: Never Used  Substance Use Topics   Alcohol use: No   Drug use: No     Allergies   Penicillins, Sulfa antibiotics, and Latex   Review of Systems Review of Systems  Constitutional:  Negative for chills and fever.  HENT:  Positive for congestion. Negative for ear pain and sore throat.   Respiratory:  Positive  for cough. Negative for shortness of breath.   Cardiovascular:  Negative for chest pain and palpitations.  Gastrointestinal:  Negative for diarrhea and vomiting.  Neurological:  Positive for headaches.     Physical Exam Triage Vital Signs ED Triage Vitals  Encounter Vitals Group     BP      Systolic BP Percentile      Diastolic BP Percentile      Pulse      Resp      Temp      Temp src      SpO2      Weight      Height      Head Circumference      Peak Flow      Pain Score      Pain Loc      Pain Education      Exclude from Growth Chart    No data found.  Updated Vital Signs BP 129/80 (BP Location: Left Arm)   Pulse 69   Temp 97.9 F (36.6 C) (Oral)   Resp 18   SpO2 94%   Visual Acuity Right Eye Distance:   Left Eye Distance:   Bilateral Distance:    Right Eye Near:   Left Eye Near:    Bilateral Near:     Physical Exam Vitals and nursing note reviewed.  Constitutional:      General: She is not in acute distress.    Appearance: She is well-developed.  HENT:     Right Ear: Tympanic membrane normal.     Left Ear: Tympanic membrane normal.     Nose: Rhinorrhea present.     Mouth/Throat:     Mouth: Mucous membranes are moist.     Pharynx: Oropharynx is clear.  Cardiovascular:     Rate and Rhythm: Normal rate and regular rhythm.     Heart sounds: Normal heart sounds.  Pulmonary:     Effort: Pulmonary effort is normal. No respiratory distress.     Breath sounds: Normal breath sounds.  Musculoskeletal:     Cervical back: Neck supple.  Skin:    General: Skin is warm and dry.  Neurological:     Mental Status: She is alert.      UC Treatments / Results  Labs (all labs ordered are listed, but only abnormal results are displayed) Labs Reviewed  SARS CORONAVIRUS 2 (TAT 6-24 HRS)    EKG   Radiology No results found.  Procedures Procedures (including critical care time)  Medications Ordered in UC Medications - No data to display  Initial  Impression / Assessment and Plan / UC Course  I have reviewed the triage vital signs and the nursing notes.  Pertinent labs & imaging results that were available during my care of the patient were reviewed by me and considered in my medical decision making (see chart for details).    Viral illness.  COVID pending.  If COVID positive, recommend treatment with molnupiravir.  No recent lab work in chart.  Discussed symptomatic treatment including Tylenol, rest, hydration.  Instructed patient to follow up with PCP if symptoms are not improving.  She agrees to plan of care.   Final Clinical Impressions(s) / UC Diagnoses   Final diagnoses:  Viral illness     Discharge Instructions      Your COVID test is pending.    Take Tylenol as needed for fever or discomfort.  Rest and keep yourself hydrated.    Follow-up with your primary care provider if your symptoms are not improving.         ED Prescriptions   None    PDMP not reviewed this encounter.   Mickie Bail, NP 11/25/22 [phone]

## 2022-11-26 ENCOUNTER — Telehealth: Payer: Self-pay

## 2022-11-26 MED ORDER — MOLNUPIRAVIR 200 MG PO CAPS: 4.0000 | ORAL_CAPSULE | Freq: Two times a day (BID) | ORAL | 0 refills | Status: AC

## 2022-11-26 NOTE — Telephone Encounter (Signed)
Per provider note, pt is candidate for molnupiravir. Attempted to reach patient x1. LVM. Rx sent to pharmacy on file.

## 2022-12-10 ENCOUNTER — Telehealth: Payer: Self-pay | Admitting: Family Medicine

## 2022-12-10 ENCOUNTER — Encounter: Payer: Self-pay | Admitting: Family Medicine

## 2022-12-10 NOTE — Telephone Encounter (Signed)
Patient called in stated that he Diverticulitis flared up and can the Dr.G Calleros her a prescription  in to the pharmacy .    [id number] [place], [place] - [address]  in to the pharmacy      Phone: [phone]  Fax: [phone]

## 2022-12-11 NOTE — Telephone Encounter (Signed)
Lvm asking pt to Supak back. Needs OV for possible diverticulitis flare with an available provider.

## 2022-12-12 NOTE — Telephone Encounter (Signed)
Lvm asking pt to Supak back. Needs OV for possible diverticulitis flare with an available provider.

## 2022-12-15 NOTE — Telephone Encounter (Signed)
Lvm asking pt to Flis back. Needs OV for possible diverticulitis flare with an available provider.    Also, sent MyChart message.

## 2022-12-17 NOTE — Telephone Encounter (Signed)
error 

## 2023-01-12 DIAGNOSIS — R051 Acute cough: Secondary | ICD-10-CM | POA: Diagnosis not present

## 2023-01-12 DIAGNOSIS — J22 Unspecified acute lower respiratory infection: Secondary | ICD-10-CM | POA: Diagnosis not present

## 2023-01-26 DIAGNOSIS — Z01818 Encounter for other preprocedural examination: Secondary | ICD-10-CM | POA: Diagnosis not present

## 2023-01-26 DIAGNOSIS — Z860100 Personal history of colon polyps, unspecified: Secondary | ICD-10-CM | POA: Diagnosis not present

## 2023-02-06 HISTORY — PX: COLONOSCOPY: SHX174

## 2023-02-09 ENCOUNTER — Ambulatory Visit: Payer: Medicare Other

## 2023-02-09 DIAGNOSIS — Z860100 Personal history of colon polyps, unspecified: Secondary | ICD-10-CM | POA: Diagnosis not present

## 2023-02-09 DIAGNOSIS — K635 Polyp of colon: Secondary | ICD-10-CM | POA: Diagnosis not present

## 2023-02-09 DIAGNOSIS — K5730 Diverticulosis of large intestine without perforation or abscess without bleeding: Secondary | ICD-10-CM | POA: Diagnosis not present

## 2023-02-09 DIAGNOSIS — Z1211 Encounter for screening for malignant neoplasm of colon: Secondary | ICD-10-CM | POA: Diagnosis not present

## 2023-02-09 DIAGNOSIS — K640 First degree hemorrhoids: Secondary | ICD-10-CM | POA: Diagnosis not present

## 2023-02-14 ENCOUNTER — Other Ambulatory Visit: Payer: Self-pay | Admitting: Family Medicine

## 2023-02-14 DIAGNOSIS — R7303 Prediabetes: Secondary | ICD-10-CM | POA: Insufficient documentation

## 2023-02-14 DIAGNOSIS — M797 Fibromyalgia: Secondary | ICD-10-CM

## 2023-02-14 DIAGNOSIS — E782 Mixed hyperlipidemia: Secondary | ICD-10-CM

## 2023-02-16 ENCOUNTER — Other Ambulatory Visit (INDEPENDENT_AMBULATORY_CARE_PROVIDER_SITE_OTHER): Payer: Medicare Other

## 2023-02-16 DIAGNOSIS — M797 Fibromyalgia: Secondary | ICD-10-CM | POA: Diagnosis not present

## 2023-02-16 DIAGNOSIS — E782 Mixed hyperlipidemia: Secondary | ICD-10-CM | POA: Diagnosis not present

## 2023-02-16 DIAGNOSIS — R7303 Prediabetes: Secondary | ICD-10-CM | POA: Diagnosis not present

## 2023-02-16 LAB — COMPREHENSIVE METABOLIC PANEL
ALT: 15 U/L (ref 0–35)
AST: 14 U/L (ref 0–37)
Albumin: 4.2 g/dL (ref 3.5–5.2)
Alkaline Phosphatase: 103 U/L (ref 39–117)
BUN: 13 mg/dL (ref 6–23)
CO2: 30 meq/L (ref 19–32)
Calcium: 9.1 mg/dL (ref 8.4–10.5)
Chloride: 104 meq/L (ref 96–112)
Creatinine, Ser: 0.71 mg/dL (ref 0.40–1.20)
GFR: 89.26 mL/min (ref >60.00)
Glucose, Bld: 90 mg/dL (ref 70–99)
Potassium: 4.2 meq/L (ref 3.5–5.1)
Sodium: 142 meq/L (ref 135–145)
Total Bilirubin: 0.7 mg/dL (ref 0.2–1.2)
Total Protein: 6.7 g/dL (ref 6.0–8.3)

## 2023-02-16 LAB — CBC WITH DIFFERENTIAL/PLATELET
Basophils Absolute: 0.1 10*3/uL (ref 0.0–0.1)
Basophils Relative: 0.7 % (ref 0.0–3.0)
Eosinophils Absolute: 0.2 10*3/uL (ref 0.0–0.7)
Eosinophils Relative: 2.3 % (ref 0.0–5.0)
HCT: 42.9 % (ref 36.0–46.0)
Hemoglobin: 14.5 g/dL (ref 12.0–15.0)
Lymphocytes Relative: 30.6 % (ref 12.0–46.0)
Lymphs Abs: 2.2 10*3/uL (ref 0.7–4.0)
MCHC: 33.7 g/dL (ref 30.0–36.0)
MCV: 89.2 fL (ref 78.0–100.0)
Monocytes Absolute: 0.5 10*3/uL (ref 0.1–1.0)
Monocytes Relative: 6.9 % (ref 3.0–12.0)
Neutro Abs: 4.3 10*3/uL (ref 1.4–7.7)
Neutrophils Relative %: 59.5 % (ref 43.0–77.0)
Platelets: 219 10*3/uL (ref 150.0–400.0)
RBC: 4.81 Mil/uL (ref 3.87–5.11)
RDW: 13.7 % (ref 11.5–15.5)
WBC: 7.2 10*3/uL (ref 4.0–10.5)

## 2023-02-16 LAB — TSH: TSH: 1.28 u[IU]/mL (ref 0.35–5.50)

## 2023-02-16 LAB — LIPID PANEL
Cholesterol: 209 mg/dL — ABNORMAL HIGH (ref 0–200)
HDL: 38.8 mg/dL — ABNORMAL LOW (ref >39.00)
LDL Cholesterol: 111 mg/dL — ABNORMAL HIGH (ref 0–99)
NonHDL: 170.07
Total CHOL/HDL Ratio: 5
Triglycerides: 293 mg/dL — ABNORMAL HIGH (ref 0.0–149.0)
VLDL: 58.6 mg/dL — ABNORMAL HIGH (ref 0.0–40.0)

## 2023-02-16 LAB — HEMOGLOBIN A1C: Hgb A1c MFr Bld: 5.8 % (ref 4.6–6.5)

## 2023-02-23 ENCOUNTER — Encounter: Payer: Self-pay | Admitting: Family Medicine

## 2023-02-23 ENCOUNTER — Other Ambulatory Visit (HOSPITAL_COMMUNITY)
Admission: RE | Admit: 2023-02-23 | Discharge: 2023-02-23 | Disposition: A | Discharge status: Home / Self Care | Payer: Medicare Other | Source: Ambulatory Visit | Attending: Family Medicine | Admitting: Family Medicine

## 2023-02-23 ENCOUNTER — Ambulatory Visit: Payer: Medicare Other | Admitting: Family Medicine

## 2023-02-23 VITALS — BP 124/84 | HR 70 | Temp 98.1°F | Ht 65.5 in | Wt 193.1 lb

## 2023-02-23 DIAGNOSIS — R7303 Prediabetes: Secondary | ICD-10-CM

## 2023-02-23 DIAGNOSIS — Z0000 Encounter for general adult medical examination without abnormal findings: Secondary | ICD-10-CM | POA: Diagnosis not present

## 2023-02-23 DIAGNOSIS — Z01419 Encounter for gynecological examination (general) (routine) without abnormal findings: Secondary | ICD-10-CM | POA: Diagnosis present

## 2023-02-23 DIAGNOSIS — Z23 Encounter for immunization: Secondary | ICD-10-CM

## 2023-02-23 DIAGNOSIS — Z6831 Body mass index (BMI) 31.0-31.9, adult: Secondary | ICD-10-CM

## 2023-02-23 DIAGNOSIS — Z1151 Encounter for screening for human papillomavirus (HPV): Secondary | ICD-10-CM | POA: Diagnosis not present

## 2023-02-23 DIAGNOSIS — E782 Mixed hyperlipidemia: Secondary | ICD-10-CM | POA: Diagnosis not present

## 2023-02-23 DIAGNOSIS — R3915 Urgency of urination: Secondary | ICD-10-CM | POA: Diagnosis not present

## 2023-02-23 DIAGNOSIS — Z8719 Personal history of other diseases of the digestive system: Secondary | ICD-10-CM | POA: Diagnosis not present

## 2023-02-23 DIAGNOSIS — Z7189 Other specified counseling: Secondary | ICD-10-CM | POA: Diagnosis not present

## 2023-02-23 DIAGNOSIS — E2839 Other primary ovarian failure: Secondary | ICD-10-CM

## 2023-02-23 DIAGNOSIS — Z87891 Personal history of nicotine dependence: Secondary | ICD-10-CM

## 2023-02-23 DIAGNOSIS — Z8551 Personal history of malignant neoplasm of bladder: Secondary | ICD-10-CM

## 2023-02-23 DIAGNOSIS — E66811 Obesity, class 1: Secondary | ICD-10-CM

## 2023-02-23 DIAGNOSIS — Z808 Family history of malignant neoplasm of other organs or systems: Secondary | ICD-10-CM | POA: Insufficient documentation

## 2023-02-23 LAB — POC URINALSYSI DIPSTICK (AUTOMATED)
Bilirubin, UA: NEGATIVE
Blood, UA: NEGATIVE
Glucose, UA: NEGATIVE
Ketones, UA: NEGATIVE
Leukocytes, UA: NEGATIVE
Nitrite, UA: NEGATIVE
Protein, UA: NEGATIVE
Spec Grav, UA: >=1.03 — AB (ref 1.010–1.025)
Urobilinogen, UA: 0.2 U/dL
pH, UA: 5 (ref 5.0–8.0)

## 2023-02-23 NOTE — Patient Instructions (Addendum)
Flu shot today  Urinalysis today. Watch bladder irritants (caffeine, dark sodas, spicy foods).  [facility] urology at [phone] to schedule appointment for history of bladder cancer.  I have placed referral for repeat lung cancer screening - their program phone number is [phone].  Hands to schedule bone density scan at your convenience: [facility] at [facility] [phone] Do recommend 1200mg  calcium daily, most ideally from the diet. Also recommend vitamin D 1000 units daily.  To figure out dietary calcium: 300 mg/day from all non dairy foods plus 300 mg per cup of milk, other dairy, or fortified juice. Non dairy foods that contain calcium: Kale, oranges, sardines, oatmeal, soy milk/soybeans, salmon, white beans, dried figs, turnip greens, almonds, broccoli, tofu.  We will refer you to dermatologist.  Bring [place] copy of your living will Return as needed or in 6 months for follow up visit

## 2023-02-23 NOTE — Assessment & Plan Note (Signed)
Refer to dermatology for skin exam. 

## 2023-02-23 NOTE — Assessment & Plan Note (Signed)
Refer for lung cancer screening program.

## 2023-02-23 NOTE — Progress Notes (Unsigned)
Ph: [phone] Fax: [phone]   Patient ID: Rebekah Myers, female    DOB: 11/13/1955, 67 y.o.   MRN: [id number]  This visit was conducted in person.  BP 124/84   Pulse 70   Temp 98.1 F (36.7 C) (Oral)   Ht 5' 5.5" (1.664 m)   Wt 193 lb 2 oz (87.6 kg)   SpO2 100%   BMI 31.65 kg/m    CC: welcome to medicare  Subjective:   HPI: Rebekah Myers is a 67 y.o. female presenting on 02/23/2023 for Welcome to Medicare Exam   Hearing Screening   500Hz  1000Hz  2000Hz  4000Hz   Right ear 20 40 40 0  Left ear 40 40 40 0   Vision Screening   Right eye Left eye Both eyes  Without correction     With correction 20/20 20/25 20/20     Flowsheet Row Office Visit from 02/23/2023 in Re[facility]ah Myers  PHQ-2 Total Score 1          02/23/2023    3:16 PM 10/28/2022   10:13 AM 03/08/2020    9:08 AM 02/08/2020   10:46 AM 12/27/2019    7:42 AM  Fall Risk   Falls in Rebekah past year? 0 0 0 1 0  Number falls in past yr:   0 0 0  Injury with Fall?   0 0 0  Follow up    Falls evaluation completed Falls evaluation completed   H/o diverticulitis latest hospitalization 2022.  H/o bladder cancer ~2011 previously followed by [facility] urology Dr Rebekah Myers s/p TURBT followed by Re[facility]. Last visit we referred to establish locally [facility]). They were unable to reach pt to schedule appt, pt states she never received calls. # provided to Rebekah Myers and schedule.   Preventative: Colonoscopy 2016 [facility]) - diverticulosis, int hem  Colonoscopy 02/2023 - [facility], diverticulosis rpt 10 yrs [facility])  Mammogram 09/2020 - Birads1 @ [facility] - upcoming appt next week Well woman exam - with previous PCP, normal in Rebekah past. Rpt today. No pelvic pain, vaginal bleeding  Lung cancer screening - discussed - will rerefer. Last done 2021.  DEXA scan - discussed, will order Flu shot today COVID shot - x2 - declines further  Tdap 02/2022 Pneumonia shot - 2013, 2016 Shingrix - discussed,  declines Advanced directive discussion - has this at home. Son Rebekah Myers is [employer]. Doesn't think would want to be resuscitated but will continue to consider, definitely no prolonged life support if terminal condition. Asked to bring [place] copy.  Seat belt use discussed Sunscreen use discussed. Darker spot to R lower abd - SK. Fmhx melanoma (father).  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 Interim medical history since our last visit reviewed. Allergies and medications reviewed and updated. No outpatient medications prior to visit.   No facility-administered medications prior to visit.     Per HPI unless specifically indicated in ROS section below Review of Systems  Objective:  BP 124/84   Pulse 70  Temp 98.1 F (36.7 C) (Oral)   Ht 5' 5.5" (1.664 m)   Wt 193 lb 2 oz (87.6 kg)   SpO2 100%   BMI 31.65 kg/m   Wt Readings from Last 3 Encounters:  02/23/23 193 lb 2 oz (87.6 kg)  10/28/22 192 lb 6 oz (87.3 kg)  12/20/21 186 lb (84.4 kg)      Physical Exam Vitals and nursing note reviewed. Exam conducted with a chaperone present.  Constitutional:      Appearance: Normal appearance. She is not ill-appearing.  HENT:     Head: Normocephalic and atraumatic.     Right Ear: Tympanic membrane, ear canal and external ear normal. There is no impacted cerumen.     Left Ear: Tympanic membrane, ear canal and external ear normal. There is no impacted cerumen.     Mouth/Throat:     Mouth: Mucous membranes are moist.     Pharynx: Oropharynx is  clear. No oropharyngeal exudate or posterior oropharyngeal erythema.  Eyes:     General:        Right eye: No discharge.        Left eye: No discharge.     Extraocular Movements: Extraocular movements intact.     Conjunctiva/sclera: Conjunctivae normal.     Pupils: Pupils are equal, round, and reactive to light.  Neck:     Thyroid: No thyroid mass or thyromegaly.     Vascular: No carotid bruit.  Cardiovascular:     Rate and Rhythm: Normal rate and regular rhythm.     Pulses: Normal pulses.     Heart sounds: Normal heart sounds. No murmur heard. Pulmonary:     Effort: Pulmonary effort is normal. No respiratory distress.     Breath sounds: Normal breath sounds. No wheezing, rhonchi or rales.  Abdominal:     General: Bowel sounds are normal. There is no distension.     Palpations: Abdomen is soft. There is no mass.     Tenderness: There is no abdominal tenderness. There is no guarding or rebound.     Hernia: No hernia is present.  Genitourinary:    Exam position: Lithotomy position.     Labia:        Right: No rash, tenderness or lesion.        Left: No rash, tenderness or lesion.      Vagina: Normal.     Cervix: Normal.     Uterus: Normal.      Adnexa: Right adnexa normal and left adnexa normal.     Comments: Pap performed on cervix Musculoskeletal:     Cervical back: Normal range of motion and neck supple. No rigidity.     Right lower leg: No edema.     Left lower leg: No edema.  Lymphadenopathy:     Cervical: No cervical adenopathy.  Skin:    General: Skin is warm and dry.     Findings: No rash.  Neurological:     General: No focal deficit present.     Mental Status: She is alert. Mental status is at baseline.     Comments:  Recall 3/3  Calculation 5/5 DLROW  Psychiatric:        Mood and Affect: Mood normal.        Behavior: Behavior normal.       Results for orders placed or performed in visit on 02/23/23  POCT Urinalysis Dipstick (Automated)  Result Value Ref  Range   Color, UA yellow  Clarity, UA clear    Glucose, UA Negative Negative   Bilirubin, UA negative    Ketones, UA negative    Spec Grav, UA >=1.030 (A) 1.010 - 1.025   Blood, UA negative    pH, UA 5.0 5.0 - 8.0   Protein, UA Negative Negative   Urobilinogen, UA 0.2 0.2 or 1.0 E.U./dL   Nitrite, UA negative    Leukocytes, UA Negative Negative   H/o L heart catheterization 12/2021:   Mid LAD lesion is 30% stenosed.   Rebekah left ventricular systolic function is normal.   LV end diastolic pressure is moderately elevated.   Rebekah left ventricular ejection fraction is greater than 65% by visual estimate. Mild irregularities in mid LAD otherwise normal, normal EF, non cardiac chest pain(GERD).  EKG - NSR rate 75, normal axis, intervals, no hypertrophy or acute ST/T changes, diffuse ST flattening  Assessment & Plan:   Problem List Items Addressed This Visit     Welcome to Medicare preventive visit - Primary (Chronic)    I have personally reviewed Rebekah Medicare Annual Wellness questionnaire and have noted 1. Rebekah patient's medical and social history 2. Their use of alcohol, tobacco or illicit drugs 3. Their current medications and supplements 4. Rebekah patient's functional ability including ADL's, fall risks, home safety risks and hearing or visual impairment. Cognitive function has been assessed and addressed as indicated.  5. Diet and physical activity 6. Evidence for depression or mood disorders Rebekah patients weight, height, BMI have been recorded in Rebekah chart. I have made referrals, counseling and provided education to Rebekah patient based on review of Rebekah above and I have provided Rebekah pt with a written personalized care plan for preventive services. Provider list updated.. See scanned questionairre as needed for further documentation. Reviewed preventative protocols and updated unless pt declined.       Relevant Orders   EKG 12-Lead (Completed)   Advanced directives,  counseling/discussion (Chronic)    Advanced directive discussion - has this at home. Son [place] is [employer]. Doesn't think would want to be resuscitated but will continue to consider, definitely no prolonged life support if terminal condition. Asked to bring [place] copy.       HLD (hyperlipidemia)    Chronic, reviewed diet choices to improve cholesterol levels. Rebekah 10-year ASCVD risk score (Arnett DK, et al., 2019) is: 6.2%   Values used to calculate Rebekah score:     Age: 89 years     Sex: Female     Is Non-Hispanic African American: No     Diabetic: No     Tobacco smoker: No     Systolic Blood Pressure: 124 mmHg     Is BP treated: No     HDL Cholesterol: 38.8 mg/dL     Total Cholesterol: 209 mg/dL       Hx of bladder cancer    # provided to Tseng and schedule urologist appt [facility]) UA today without blood      Obesity (BMI 30.0-34.9)    Encouraged healthy diet and lifestyle choices to affect sustainable weight loss.  She is motivated to work on [employer] and being active at [employer]      History of diverticulitis    Reviewed clear liquid diet at onset of symptoms, to seek medical care if ongoing symptoms despite this.       Ex-smoker    Refer for lung cancer screening program.       Relevant Orders   Ambulatory  Referral Lung Cancer Screening [facility]   Prediabetes    Encouraged limiting added sugar in diet      Family history of malignant melanoma    Refer to dermatology for skin exam       Relevant Orders   Ambulatory referral to Dermatology   Urinary urgency    Anticipate OAB related. She declines medication.  Check UA today - no signs of blood or infection.  Reviewed limiting bladder irritants.       Relevant Orders   POCT Urinalysis Dipstick (Automated) (Completed)   Other Visit Diagnoses     Encounter for annual routine gynecological examination       Relevant Orders   Cytology - PAP   Estrogen deficiency       Relevant Orders    DG Bone Density   Encounter for immunization       Relevant Orders   Flu Vaccine Trivalent High Dose (Fluad) (Completed)        No orders of Rebekah defined types were placed in this encounter.   Orders Placed This Encounter  Procedures   DG Bone Density    Standing Status:   Future    Standing Expiration Date:   02/23/2024    Order Specific Question:   Reason for Exam (SYMPTOM  OR DIAGNOSIS REQUIRED)    Answer:   op screening    Order Specific Question:   Preferred imaging location?    Answer:   [facility]   Flu Vaccine Trivalent High Dose (Fluad)   Ambulatory Referral Lung Cancer Screening [facility]    Referral Priority:   Routine    Referral Type:   Consultation    Referral Reason:   Specialty Services Required    Number of Visits Requested:   1   Ambulatory referral to Dermatology    Referral Priority:   Routine    Referral Type:   Consultation    Referral Reason:   Specialty Services Required    Requested Specialty:   Dermatology    Number of Visits Requested:   1   POCT Urinalysis Dipstick (Automated)   EKG 12-Lead    Patient Instructions  Flu shot today  Urinalysis today. Watch bladder irritants (caffeine, dark sodas, spicy foods).  [facility] urology at [phone] to schedule appointment for history of bladder cancer.  I have placed referral for repeat lung cancer screening - their program phone number is [phone].  [place] to schedule bone density scan at your convenience: [facility] [phone] Do recommend 1200mg  calcium daily, most ideally from Rebekah diet. Also recommend vitamin D 1000 units daily.  To figure out dietary calcium: 300 mg/day from all non dairy foods plus 300 mg per cup of milk, other dairy, or fortified juice. Non dairy foods that contain calcium: Kale, oranges, sardines, oatmeal, soy milk/soybeans, salmon, white beans, dried figs, turnip greens, almonds, broccoli, tofu.  We will refer you to  dermatologist.  Bring [place] copy of your living will Return as needed or in 6 months for follow up visit   Follow up plan: Return in about 6 months (around 08/23/2023) for follow up visit.  Eustaquio Boyden, MD

## 2023-02-23 NOTE — Assessment & Plan Note (Signed)
Chronic, reviewed diet choices to improve cholesterol levels. The 10-year ASCVD risk score (Arnett DK, et al., 2019) is: 6.2%   Values used to calculate the score:     Age: 67 years     Sex: Female     Is Non-Hispanic African American: No     Diabetic: No     Tobacco smoker: No     Systolic Blood Pressure: 124 mmHg     Is BP treated: No     HDL Cholesterol: 38.8 mg/dL     Total Cholesterol: 209 mg/dL

## 2023-02-23 NOTE — Assessment & Plan Note (Signed)

## 2023-02-23 NOTE — Assessment & Plan Note (Signed)
Advanced directive discussion - has this at home. Son [place] is [employer]. Doesn't think would want to be resuscitated, definitely no prolonged life support if terminal condition. Asked to bring [place] copy.

## 2023-02-24 DIAGNOSIS — R3915 Urgency of urination: Secondary | ICD-10-CM | POA: Insufficient documentation

## 2023-02-24 NOTE — Assessment & Plan Note (Signed)
Encouraged limiting added sugar in diet.  

## 2023-02-24 NOTE — Assessment & Plan Note (Addendum)
#   provided to [place] and schedule urologist appt [facility]) UA today without blood

## 2023-02-24 NOTE — Assessment & Plan Note (Signed)
Reviewed clear liquid diet at onset of symptoms, to seek medical care if ongoing symptoms despite this.

## 2023-02-24 NOTE — Assessment & Plan Note (Addendum)
Encouraged healthy diet and lifestyle choices to affect sustainable weight loss.  She is motivated to work on [employer] and being active at [employer]

## 2023-02-24 NOTE — Assessment & Plan Note (Signed)
Anticipate OAB related. She declines medication.  Check UA today - no signs of blood or infection.  Reviewed limiting bladder irritants.

## 2023-02-25 ENCOUNTER — Encounter: Payer: Medicare Other | Admitting: Family Medicine

## 2023-02-26 ENCOUNTER — Encounter: Payer: Self-pay | Admitting: Family Medicine

## 2023-02-26 LAB — CYTOLOGY - PAP
Comment: NEGATIVE
Diagnosis: NEGATIVE
High risk HPV: NEGATIVE

## 2023-03-02 ENCOUNTER — Other Ambulatory Visit: Payer: Self-pay | Admitting: Family Medicine

## 2023-03-02 DIAGNOSIS — Z1231 Encounter for screening mammogram for malignant neoplasm of breast: Secondary | ICD-10-CM

## 2023-03-10 ENCOUNTER — Other Ambulatory Visit: Payer: Self-pay

## 2023-03-10 DIAGNOSIS — Z122 Encounter for screening for malignant neoplasm of respiratory organs: Secondary | ICD-10-CM

## 2023-03-10 DIAGNOSIS — Z87891 Personal history of nicotine dependence: Secondary | ICD-10-CM

## 2023-04-06 ENCOUNTER — Ambulatory Visit
Admission: RE | Admit: 2023-04-06 | Discharge: 2023-04-06 | Disposition: A | Discharge status: Home / Self Care | Payer: Medicare Other | Source: Ambulatory Visit | Attending: Acute Care | Admitting: Acute Care

## 2023-04-06 DIAGNOSIS — Z122 Encounter for screening for malignant neoplasm of respiratory organs: Secondary | ICD-10-CM | POA: Diagnosis not present

## 2023-04-06 DIAGNOSIS — Z87891 Personal history of nicotine dependence: Secondary | ICD-10-CM

## 2023-04-20 ENCOUNTER — Other Ambulatory Visit: Payer: Self-pay

## 2023-04-20 DIAGNOSIS — Z122 Encounter for screening for malignant neoplasm of respiratory organs: Secondary | ICD-10-CM

## 2023-04-20 DIAGNOSIS — Z87891 Personal history of nicotine dependence: Secondary | ICD-10-CM

## 2023-05-21 ENCOUNTER — Ambulatory Visit
Admission: RE | Admit: 2023-05-21 | Discharge: 2023-05-21 | Disposition: A | Discharge status: Home / Self Care | Payer: Medicare Other | Source: Ambulatory Visit | Attending: Family Medicine | Admitting: Family Medicine

## 2023-05-21 DIAGNOSIS — M810 Age-related osteoporosis without current pathological fracture: Secondary | ICD-10-CM | POA: Diagnosis not present

## 2023-05-21 DIAGNOSIS — Z1231 Encounter for screening mammogram for malignant neoplasm of breast: Secondary | ICD-10-CM | POA: Insufficient documentation

## 2023-05-21 DIAGNOSIS — E2839 Other primary ovarian failure: Secondary | ICD-10-CM | POA: Diagnosis present

## 2023-05-28 ENCOUNTER — Encounter: Payer: Self-pay | Admitting: Family Medicine

## 2023-05-28 DIAGNOSIS — M810 Age-related osteoporosis without current pathological fracture: Secondary | ICD-10-CM | POA: Insufficient documentation

## 2023-08-24 ENCOUNTER — Ambulatory Visit: Payer: Medicare Other | Admitting: Family Medicine

## 2023-09-01 ENCOUNTER — Ambulatory Visit: Admitting: Family Medicine

## 2023-09-29 ENCOUNTER — Ambulatory Visit: Admitting: Family Medicine

## 2023-11-09 ENCOUNTER — Ambulatory Visit (INDEPENDENT_AMBULATORY_CARE_PROVIDER_SITE_OTHER): Admitting: Family Medicine

## 2023-11-09 ENCOUNTER — Encounter: Payer: Self-pay | Admitting: Family Medicine

## 2023-11-09 ENCOUNTER — Ambulatory Visit
Admission: RE | Admit: 2023-11-09 | Discharge: 2023-11-09 | Disposition: A | Discharge status: Home / Self Care | Source: Ambulatory Visit | Attending: Family Medicine | Admitting: Family Medicine

## 2023-11-09 VITALS — BP 118/84 | HR 72 | Temp 97.9°F | Ht 65.5 in | Wt 198.2 lb

## 2023-11-09 DIAGNOSIS — E739 Lactose intolerance, unspecified: Secondary | ICD-10-CM | POA: Insufficient documentation

## 2023-11-09 DIAGNOSIS — S99922A Unspecified injury of left foot, initial encounter: Secondary | ICD-10-CM

## 2023-11-09 DIAGNOSIS — M810 Age-related osteoporosis without current pathological fracture: Secondary | ICD-10-CM | POA: Diagnosis not present

## 2023-11-09 DIAGNOSIS — M7732 Calcaneal spur, left foot: Secondary | ICD-10-CM | POA: Diagnosis not present

## 2023-11-09 DIAGNOSIS — M7662 Achilles tendinitis, left leg: Secondary | ICD-10-CM | POA: Diagnosis not present

## 2023-11-09 DIAGNOSIS — M19072 Primary osteoarthritis, left ankle and foot: Secondary | ICD-10-CM | POA: Diagnosis not present

## 2023-11-09 DIAGNOSIS — S92515A Nondisplaced fracture of proximal phalanx of left lesser toe(s), initial encounter for closed fracture: Secondary | ICD-10-CM | POA: Diagnosis not present

## 2023-11-09 LAB — VITAMIN D 25 HYDROXY (VIT D DEFICIENCY, FRACTURES): VITD: 19.99 ng/mL — ABNORMAL LOW (ref 30.00–100.00)

## 2023-11-09 MED ORDER — CALCIUM 600 600 MG PO TABS: 600.0000 mg | ORAL_TABLET | Freq: Two times a day (BID) | ORAL | Status: AC

## 2023-11-09 MED ORDER — ALENDRONATE SODIUM 70 MG PO TABS: 70.0000 mg | ORAL_TABLET | ORAL | 3 refills | Status: AC

## 2023-11-09 NOTE — Patient Instructions (Addendum)
 Recommend 1200mg  calcium  intake daily, ideally most in the diet but if unable to reach this then consider starting 600mg  calcium  supplement.  To figure out dietary calcium : 300 mg/day from all non dairy foods plus 300 mg per cup of milk, other dairy, or fortified juice. Non dairy foods that contain calcium : Kale, oranges, sardines, oatmeal, soy milk/soybeans, salmon, white beans, dried figs, turnip greens, almonds, broccoli, tofu.   Recommend regular weight bearing exercise to keep bones strong See osteoporosis diet handout   Vitamin D  levels today (blood test).  Try fosamax  weekly - sent to pharmacy   Left foot xray today  Use post op shoe, continue buddy taping, ice to foot (covered in towel). Let us  know if not improving with this.

## 2023-11-09 NOTE — Assessment & Plan Note (Addendum)
 Anticipate L 4th toe fracture.  Check films - consistent with proximal phalanx fracture of L 4th toe.  Reviewed anticipated course of resolution.  Discussed buddy taping.  Place in post op shoe.  Further supportive measures reviewed including elevation, ice, avoiding walking barefoot

## 2023-11-09 NOTE — Assessment & Plan Note (Signed)
 Reviewed latest DEXA scan along with calcium /vit D intake recommendations. Check vit D levels today. She is interested in medication treatment. Reviewed risk and benefit profile of bisphosphonate including monitoring for ON of jaw and atypical femur fractures. Start fosamax  weekly. Reviewed optimal administration strategy.

## 2023-11-09 NOTE — Assessment & Plan Note (Signed)
 Describes story consistent with this - longstanding dairy intolerance.  Discussed lactase, lactaid, milk substitutes.

## 2023-11-09 NOTE — Progress Notes (Signed)
 Ph: [phone] Fax: [phone]   Patient ID: Ellouise SAUNDERS Tomasetti, female    DOB: 02/21/1956, 68 y.o.   MRN: [id number]  This visit was conducted in person.  BP 118/84   Pulse 72   Temp 97.9 F (36.6 C) (Oral)   Ht 5' 5.5 (1.664 m)   Wt 198 lb 4 oz (89.9 kg)   SpO2 100%   BMI 32.49 kg/m    CC: 6 mo f/u visit , discuss osteoporosis Subjective:   HPI: Zari Cly Hayner is a 68 y.o. female presenting on 11/09/2023 for Medical Management of Chronic Issues ( f/u )   Stressful last 6 months.   Recent DEXA showing borderline osteoporosis - doesn't drink milk. Possible lactose intolerance.   DEXA 05/2023: T -2.6 LFN, -2.4 spine   H/o bladder cancer ~2011 previously followed by [facility] urology Dr Selestine s/p TURBT followed by BCG therapy. Has not established locally.   Continues lung cancer screening CT, latest 03/2023.   DOI: 3 wks ago she stubbed toe on steel bar under bed. Initially swollen, bruised, now staying sore to touch, swollen.      Relevant past medical, surgical, family and social history reviewed and updated as indicated. Interim medical history since our last visit reviewed. Allergies and medications reviewed and updated. No outpatient medications prior to visit.   No facility-administered medications prior to visit.     Per HPI unless specifically indicated in ROS section below Review of Systems  Objective:  BP 118/84   Pulse 72   Temp 97.9 F (36.6 C) (Oral)   Ht 5' 5.5 (1.664 m)   Wt 198 lb 4 oz (89.9 kg)   SpO2 100%   BMI 32.49 kg/m   Wt Readings from Last 3 Encounters:  11/09/23 198 lb 4 oz (89.9 kg)  04/06/23 199 lb (90.3 kg)  02/23/23 193 lb 2 oz (87.6 kg)      Physical Exam Vitals and nursing note reviewed.  Constitutional:      Appearance: Normal appearance. She is not ill-appearing.  HENT:     Head: Normocephalic and atraumatic.     Mouth/Throat:     Mouth: Mucous membranes are moist.     Pharynx: Oropharynx is clear. No oropharyngeal  exudate or posterior oropharyngeal erythema.  Eyes:     Extraocular Movements: Extraocular movements intact.     Conjunctiva/sclera: Conjunctivae normal.     Pupils: Pupils are equal, round, and reactive to light.  Neck:     Thyroid : No thyroid  mass or thyromegaly.  Cardiovascular:     Rate and Rhythm: Normal rate and regular rhythm.     Pulses: Normal pulses.     Heart sounds: Normal heart sounds. No murmur heard. Pulmonary:     Effort: Pulmonary effort is normal. No respiratory distress.     Breath sounds: Normal breath sounds. No wheezing, rhonchi or rales.  Musculoskeletal:        General: Tenderness present.     Cervical back: Normal range of motion and neck supple.     Right lower leg: No edema.     Left lower leg: No edema.     Comments:  2+ DP bilaterally R foot WNL L foot: No pain at base of 5th MT, no pain at navicular Mild discomfort to palpation of 4th MT, marked tenderness to palpation of L 4th toe, exquisite pain with axial loading, flexion at that toe  Skin:    General: Skin is warm and dry.  Findings: No bruising, erythema or rash.  Neurological:     Mental Status: She is alert.  Psychiatric:        Mood and Affect: Mood normal.        Behavior: Behavior normal.       Lab Results  Component Value Date   TSH 1.28 02/16/2023    Lab Results  Component Value Date   NA 142 02/16/2023   CL 104 02/16/2023   K 4.2 02/16/2023   CO2 30 02/16/2023   BUN 13 02/16/2023   CREATININE 0.71 02/16/2023   GFR 89.26 02/16/2023   CALCIUM  9.1 02/16/2023   ALBUMIN 4.2 02/16/2023   GLUCOSE 90 02/16/2023    No results found for: 25OHVITD2, 25OHVITD3, VD25OH  Assessment & Plan:   Problem List Items Addressed This Visit     Osteoporosis - Primary   Reviewed latest DEXA scan along with calcium /vit D intake recommendations. Check vit D levels today. She is interested in medication treatment. Reviewed risk and benefit profile of bisphosphonate including  monitoring for ON of jaw and atypical femur fractures. Start fosamax  weekly. Reviewed optimal administration strategy.       Relevant Medications   calcium  carbonate (CALCIUM  600) 600 MG TABS tablet   alendronate  (FOSAMAX ) 70 MG tablet   Other Relevant Orders   VITAMIN D  25 Hydroxy (Vit-D Deficiency, Fractures)   Injury of left toe, initial encounter   Anticipate L 4th toe fracture.  Check films - consistent with proximal phalanx fracture of L 4th toe.  Reviewed anticipated course of resolution.  Discussed buddy taping.  Place in post op shoe.  Further supportive measures reviewed including elevation, ice, avoiding walking barefoot      Relevant Orders   DG Foot Complete Left   Lactose intolerance   Describes story consistent with this - longstanding dairy intolerance.  Discussed lactase, lactaid, milk substitutes.         Meds ordered this encounter  Medications   calcium  carbonate (CALCIUM  600) 600 MG TABS tablet    Sig: Take 1 tablet (600 mg total) by mouth 2 (two) times daily with a meal.   alendronate  (FOSAMAX ) 70 MG tablet    Sig: Take 1 tablet (70 mg total) by mouth every 7 (seven) days. Take with a full glass of water on an empty stomach.    Dispense:  12 tablet    Refill:  3    Orders Placed This Encounter  Procedures   DG Foot Complete Left    Standing Status:   Future    Number of Occurrences:   1    Expiration Date:   11/08/2024    Reason for Exam (SYMPTOM  OR DIAGNOSIS REQUIRED):   left 4th toe injury  3wks ago    Preferred imaging location?:   [facility]   VITAMIN D  25 Hydroxy (Vit-D Deficiency, Fractures)    Patient Instructions  Recommend 1200mg  calcium  intake daily, ideally most in the diet but if unable to reach this then consider starting 600mg  calcium  supplement.  To figure out dietary calcium : 300 mg/day from all non dairy foods plus 300 mg per cup of milk, other dairy, or fortified juice. Non dairy foods that contain calcium : Kale,  oranges, sardines, oatmeal, soy milk/soybeans, salmon, white beans, dried figs, turnip greens, almonds, broccoli, tofu.   Recommend regular weight bearing exercise to keep bones strong See osteoporosis diet handout   Vitamin D  levels today (blood test).  Try fosamax  weekly - sent to pharmacy  Left foot xray today  Use post op shoe, continue buddy taping, ice to foot (covered in towel). Let us  know if not improving with this.   Follow up plan: Return in about 6 months (around 05/11/2024), or if symptoms worsen or fail to improve, for medicare wellness visit, annual exam, prior fasting for blood work.  Anton Blas, MD

## 2023-11-12 ENCOUNTER — Ambulatory Visit: Payer: Self-pay | Admitting: Family Medicine

## 2023-11-12 MED ORDER — VITAMIN D (ERGOCALCIFEROL) 1.25 MG (50000 UNIT) PO CAPS: 50000.0000 [IU] | ORAL_CAPSULE | ORAL | 1 refills | Status: AC

## 2024-04-17 ENCOUNTER — Other Ambulatory Visit: Payer: Self-pay | Admitting: Family Medicine

## 2024-04-17 DIAGNOSIS — E559 Vitamin D deficiency, unspecified: Secondary | ICD-10-CM

## 2024-04-17 DIAGNOSIS — E782 Mixed hyperlipidemia: Secondary | ICD-10-CM

## 2024-04-17 DIAGNOSIS — R7303 Prediabetes: Secondary | ICD-10-CM

## 2024-04-17 DIAGNOSIS — Z8551 Personal history of malignant neoplasm of bladder: Secondary | ICD-10-CM

## 2024-04-18 ENCOUNTER — Ambulatory Visit

## 2024-04-18 ENCOUNTER — Other Ambulatory Visit (INDEPENDENT_AMBULATORY_CARE_PROVIDER_SITE_OTHER)

## 2024-04-18 VITALS — BP 112/78 | HR 76 | Ht 65.0 in | Wt 195.6 lb

## 2024-04-18 DIAGNOSIS — Z8551 Personal history of malignant neoplasm of bladder: Secondary | ICD-10-CM

## 2024-04-18 DIAGNOSIS — Z0000 Encounter for general adult medical examination without abnormal findings: Secondary | ICD-10-CM

## 2024-04-18 DIAGNOSIS — E782 Mixed hyperlipidemia: Secondary | ICD-10-CM

## 2024-04-18 DIAGNOSIS — R7303 Prediabetes: Secondary | ICD-10-CM | POA: Diagnosis not present

## 2024-04-18 DIAGNOSIS — E559 Vitamin D deficiency, unspecified: Secondary | ICD-10-CM | POA: Diagnosis not present

## 2024-04-18 LAB — COMPREHENSIVE METABOLIC PANEL WITH GFR
ALT: 16 U/L (ref 3–35)
AST: 14 U/L (ref 5–37)
Albumin: 4.4 g/dL (ref 3.5–5.2)
Alkaline Phosphatase: 92 U/L (ref 39–117)
BUN: 14 mg/dL (ref 6–23)
CO2: 29 meq/L (ref 19–32)
Calcium: 9 mg/dL (ref 8.4–10.5)
Chloride: 105 meq/L (ref 96–112)
Creatinine, Ser: 0.67 mg/dL (ref 0.40–1.20)
GFR: 91.01 mL/min
Glucose, Bld: 96 mg/dL (ref 70–99)
Potassium: 4.2 meq/L (ref 3.5–5.1)
Sodium: 141 meq/L (ref 135–145)
Total Bilirubin: 0.6 mg/dL (ref 0.2–1.2)
Total Protein: 7.2 g/dL (ref 6.0–8.3)

## 2024-04-18 LAB — URINALYSIS, ROUTINE W REFLEX MICROSCOPIC
Bilirubin Urine: NEGATIVE
Hgb urine dipstick: NEGATIVE
Ketones, ur: NEGATIVE
Leukocytes,Ua: NEGATIVE
Nitrite: NEGATIVE
RBC / HPF: NONE SEEN
Specific Gravity, Urine: >=1.03 — AB (ref 1.000–1.030)
Total Protein, Urine: NEGATIVE
Urine Glucose: NEGATIVE
Urobilinogen, UA: 0.2 (ref 0.0–1.0)
pH: 5.5 (ref 5.0–8.0)

## 2024-04-18 LAB — LIPID PANEL
Cholesterol: 216 mg/dL — ABNORMAL HIGH (ref 28–200)
HDL: 40.5 mg/dL
LDL Cholesterol: 135 mg/dL — ABNORMAL HIGH (ref 10–99)
NonHDL: 175.8
Total CHOL/HDL Ratio: 5
Triglycerides: 206 mg/dL — ABNORMAL HIGH (ref 10.0–149.0)
VLDL: 41.2 mg/dL — ABNORMAL HIGH (ref 0.0–40.0)

## 2024-04-18 LAB — VITAMIN D 25 HYDROXY (VIT D DEFICIENCY, FRACTURES): VITD: 17.79 ng/mL — ABNORMAL LOW (ref 30.00–100.00)

## 2024-04-18 LAB — HEMOGLOBIN A1C: Hgb A1c MFr Bld: 6.1 % (ref 4.6–6.5)

## 2024-04-18 NOTE — Progress Notes (Signed)
 "  Chief Complaint  Patient presents with   Medicare Wellness     Subjective:   Rebekah Myers is a 69 y.o. female who presents for a Medicare Annual Wellness Visit.  Visit info / Clinical Intake: Medicare Wellness Visit Type:: Initial Annual Wellness Visit Persons participating in visit and providing information:: patient Medicare Wellness Visit Mode:: In-person (required for WTM) Interpreter Needed?: No Pre-visit prep was completed: yes AWV questionnaire completed by patient prior to visit?: no Living arrangements:: lives with spouse/significant other Patient's Overall Health Status Rating: good Typical amount of pain: none Does pain affect daily life?: no Are you currently prescribed opioids?: no  Dietary Habits and Nutritional Risks How many meals a day?: 2 Eats fruit and vegetables daily?: yes Most meals are obtained by: preparing own meals In the last 2 weeks, have you had any of the following?: none Diabetic:: no  Functional Status Activities of Daily Living (to include ambulation/medication): Independent Ambulation: Independent with device- listed below Home Assistive Devices/Equipment: Eyeglasses Medication Administration: Independent Home Management (perform basic housework or laundry): Independent Manage your own finances?: yes Primary transportation is: driving Concerns about vision?: no *vision screening is required for WTM* Concerns about hearing?: no  Fall Screening Falls in the past year?: 0 Number of falls in past year: 0 Was there an injury with Fall?: 0 Fall Risk Category Calculator: 0 Patient Fall Risk Level: Low Fall Risk  Fall Risk Patient at Risk for Falls Due to: No Fall Risks Fall risk Follow up: Falls evaluation completed; Falls prevention discussed  Home and Transportation Safety: All rugs have non-skid backing?: N/A, no rugs All stairs or steps have railings?: yes (outside) Grab bars in the bathtub or shower?: yes Have non-skid surface  in bathtub or shower?: yes Good home lighting?: yes Regular seat belt use?: yes  Cognitive Assessment Difficulty concentrating, remembering, or making decisions? : no Will 6CIT or Mini Cog be Completed: yes What year is it?: 0 points What month is it?: 0 points Give patient an address phrase to remember (5 components): [address] [place], [place] About what time is it?: 0 points Count backwards from 20 to 1: 0 points Say the months of the year in reverse: 0 points Repeat the address phrase from earlier: 2 points (Drive) 6 CIT Score: 2 points  Advance Directives (For Healthcare) Does Patient Have a Medical Advance Directive?: No Would patient like information on creating a medical advance directive?: Yes (MAU/Ambulatory/Procedural Areas - Information given)  Reviewed/Updated  Reviewed/Updated: Reviewed All (Medical, Surgical, Family, Medications, Allergies, Care Teams, Patient Goals)    Allergies (verified) Penicillins, Sulfa antibiotics, and Latex   Current Medications (verified) Outpatient Encounter Medications as of 04/18/2024  Medication Sig   alendronate  (FOSAMAX ) 70 MG tablet Take 1 tablet (70 mg total) by mouth every 7 (seven) days. Take with a full glass of water on an empty stomach.   calcium  carbonate (CALCIUM  600) 600 MG TABS tablet Take 1 tablet (600 mg total) by mouth 2 (two) times daily with a meal.   Vitamin D , Ergocalciferol , (DRISDOL ) 1.25 MG (50000 UNIT) CAPS capsule Take 1 capsule (50,000 Units total) by mouth every 7 (seven) days.   No facility-administered encounter medications on file as of 04/18/2024.    History: Past Medical History:  Diagnosis Date   Arthritis    hands   Bladder cancer (HCC) 2008   surgery and 6 weeks of chemo tx   CAD (coronary artery disease) 10/28/2022   H/o L heart catheterization 12/2021:  Mid LAD lesion is 30% stenosed.    The left ventricular systolic function is normal.    LV end diastolic pressure is moderately elevated.     The left ventricular ejection fraction is greater than 65% by visual estimate.  Mild irregularities in mid LAD otherwise normal, normal EF, non cardiac chest pain(GERD).     COVID-19 05/05/2020   Home test.  Fatigue and cough.  Resolved.  01/26/22 - Resolved   History of chicken pox    History of colon polyps    History of diverticulitis 07/19/2014   Hyperlipidemia 06/28/2009   mixed   Past Surgical History:  Procedure Laterality Date   COLONOSCOPY  2013   1 polyp [facility])   COLONOSCOPY  2016   diverticulosis, int hem [facility])   COLONOSCOPY  02/2023   HP, diverticulosis, int hem, rpt 10 yrs [facility])   DILATATION & CURETTAGE/HYSTEROSCOPY WITH MYOSURE N/A 12/04/2014   Procedure: DILATATION & CURETTAGE/HYSTEROSCOPY WITH MYOSURE;  Surgeon: Debby JINNY Dinsmore, MD;  Location: [facility] ORS;  Service: Gynecology;  Laterality: N/A;   LEFT HEART CATH AND CORONARY ANGIOGRAPHY Left 12/20/2021   Procedure: LEFT HEART CATH AND CORONARY ANGIOGRAPHY;  Surgeon: Fernand Denyse LABOR, MD;  Location: [facility] INVASIVE CV LAB;  Service: Cardiovascular;  Laterality: Left;   Right Toe fracture Right 2009   Right First Toe surgery secondary to fracture   TRANSURETHRAL RESECTION OF BLADDER TUMOR  2008   as stated [facility]rology in [facility]. BCG treatments weekly x21 followed every year.   Family History  Problem Relation Age of Onset   Depression Mother    Breast cancer Mother 33   COPD Mother    Diabetes Father    Gout Father    Melanoma Father 1       deceased from melanoma   Hyperlipidemia Sister    Hypertension Sister    Aneurysm Maternal Grandmother        brain aneurysm   Breast cancer Paternal Grandmother    CAD Paternal Grandfather        MI   Social History   Occupational History   Occupation: psychologist, sport and exercise  Tobacco Use   Smoking status: Former    Current packs/day: 0.00    Average packs/day: 0.8 packs/day for 44.0 years (33.0 ttl pk-yrs)    Types: Cigarettes    Start date: 06/14/1975     Quit date: 06/14/2019    Years since quitting: 4.8   Smokeless tobacco: Never  Vaping Use   Vaping status: Never Used  Substance and Sexual Activity   Alcohol use: No   Drug use: No   Sexual activity: Yes    Birth control/protection: Post-menopausal   Tobacco Counseling Counseling given: Yes  SDOH Screenings   Food Insecurity: No Food Insecurity (04/18/2024)  Housing: Unknown (04/18/2024)  Transportation Needs: No Transportation Needs (04/18/2024)  Utilities: Not At Risk (04/18/2024)  Depression (PHQ2-9): Low Risk (04/18/2024)  Physical Activity: Insufficiently Active (04/18/2024)  Social Connections: Socially Integrated (04/18/2024)  Stress: No Stress Concern Present (04/18/2024)  Tobacco Use: Medium Risk (04/18/2024)  Health Literacy: Adequate Health Literacy (04/18/2024)   See flowsheets for full screening details  Depression Screen PHQ 2 & 9 Depression Scale- Over the past 2 weeks, how often have you been bothered by any of the following problems? Little interest or pleasure in doing things: 0 Feeling down, depressed, or hopeless (PHQ Adolescent also includes...irritable): 0 PHQ-2 Total Score: 0 Trouble falling or staying asleep, or sleeping too much: 0 Feeling tired or having  little energy: 0 Poor appetite or overeating (PHQ Adolescent also includes...weight loss): 0 Feeling bad about yourself - or that you are a failure or have let yourself or your family down: 0 Trouble concentrating on things, such as reading the newspaper or watching television (PHQ Adolescent also includes...like school work): 0 Moving or speaking so slowly that other people could have noticed. Or the opposite - being so fidgety or restless that you have been moving around a lot more than usual: 0 Thoughts that you would be better off dead, or of hurting yourself in some way: 0 PHQ-9 Total Score: 0 If you checked off any problems, how difficult have these problems made it for you to do your work, take care of  things at home, or get along with other people?: Not difficult at all  Depression Treatment Depression Interventions/Treatment : [id number] Score <4 Follow-up Not Indicated     Goals Addressed               This Visit's Progress     Patient Stated (pt-stated)        Patient stated she plans to continue taking meds and working             Objective:    Today's Vitals   04/18/24 0840  BP: 112/78  Pulse: 76  SpO2: 95%  Weight: 195 lb 9.6 oz (88.7 kg)  Height: 5' 5 (1.651 m)   Body mass index is 32.55 kg/m.  Hearing/Vision screen Hearing Screening - Comments:: Denies hearing difficulties   Vision Screening - Comments:: Wears rx glasses - up to date with routine eye exams with Optometrist Immunizations and Health Maintenance Health Maintenance  Topic Date Due   Zoster Vaccines- Shingrix (1 of 2) Never done   Pneumococcal Vaccine: 50+ Years (2 of 2 - PCV) 02/07/2016   DTaP/Tdap/Td (2 - Td or Tdap) 03/03/2022   COVID-19 Vaccine (3 - 2025-26 season) 12/07/2023   Lung Cancer Screening  04/05/2024   Mammogram  05/20/2024   Influenza Vaccine  07/05/2024 (Originally 11/06/2023)   Medicare Annual Wellness (AWV)  04/18/2025   Colonoscopy  02/19/2028   Bone Density Scan  Completed   Hepatitis C Screening  Completed   Meningococcal B Vaccine  Aged Out        Assessment/Plan:  This is a routine wellness examination for Rebekah Myers.  Mammogram status: scheduled for 05/2024 pt stated  Lung Cancer Screening status: pt plans to schedule  I have recommended that this patient have a immunization for Shingles but she declines at this time. I have discussed the risks and benefits of this procedure with her. The patient verbalizes understanding. Pt plans to get the Pneumonia and Tdap at local pharmacy.  Patient Care Team: Rilla Baller, MD as PCP - General (Family Medicine)  I have personally reviewed and noted the following in the patients chart:   Medical and social  history Use of alcohol, tobacco or illicit drugs  Current medications and supplements including opioid prescriptions. Functional ability and status Nutritional status Physical activity Advanced directives List of other physicians Hospitalizations, surgeries, and ER visits in previous 12 months Vitals Screenings to include cognitive, depression, and falls Referrals and appointments  No orders of the defined types were placed in this encounter.  In addition, I have reviewed and discussed with patient certain preventive protocols, quality metrics, and best practice recommendations. A written personalized care plan for preventive services as well as general preventive health recommendations were provided to patient.   Rebekah Glasheen  CHRISTELLA Myers, CMA   04/18/2024   Return in 1 year (on 04/18/2025).  After Visit Summary: (In Person-Declined) Patient declined AVS at this time.  Nurse Notes: scheduled 2027 AWV appt. "

## 2024-04-18 NOTE — Patient Instructions (Addendum)
 Rebekah Myers,  Thank you for taking the time for your Medicare Wellness Visit. I appreciate your continued commitment to your health goals. Please review the care plan we discussed, and feel free to reach out if I can assist you further.  Please note that Annual Wellness Visits do not include a physical exam. Some assessments may be limited, especially if the visit was conducted virtually. If needed, we may recommend an in-person follow-up with your provider.  Ongoing Care Seeing your primary care provider every 3 to 6 months helps us  monitor your health and provide consistent, personalized care.   Referrals If a referral was made during today's visit and you haven't received any updates within two weeks, please contact the referred provider directly to check on the status.  Recommended Screenings:  Health Maintenance  Topic Date Due   Zoster (Shingles) Vaccine (1 of 2) Never done   Pneumococcal Vaccine for age over 50 (2 of 2 - PCV) 02/07/2016   DTaP/Tdap/Td vaccine (2 - Td or Tdap) 03/03/2022   COVID-19 Vaccine (3 - 2025-26 season) 12/07/2023   Screening for Lung Cancer  04/05/2024   Breast Cancer Screening  05/20/2024   Flu Shot  07/05/2024*   Medicare Annual Wellness Visit  04/18/2025   Colon Cancer Screening  02/19/2028   Osteoporosis screening with Bone Density Scan  Completed   Hepatitis C Screening  Completed   Meningitis B Vaccine  Aged Out  *Topic was postponed. The date shown is not the original due date.       04/18/2024    8:41 AM  Advanced Directives  Does Patient Have a Medical Advance Directive? No  Would patient like information on creating a medical advance directive? Yes (MAU/Ambulatory/Procedural Areas - Information given)    Vision: Annual vision screenings are recommended for early detection of glaucoma, cataracts, and diabetic retinopathy. These exams can also reveal signs of chronic conditions such as diabetes and high blood pressure.  Dental: Annual dental  screenings help detect early signs of oral cancer, gum disease, and other conditions linked to overall health, including heart disease and diabetes.

## 2024-04-19 ENCOUNTER — Ambulatory Visit: Payer: Self-pay | Admitting: Family Medicine

## 2024-05-06 ENCOUNTER — Encounter: Admitting: Family Medicine

## 2024-05-24 ENCOUNTER — Encounter: Admitting: Family Medicine

## 2025-05-09 ENCOUNTER — Ambulatory Visit

## 2025-05-09 ENCOUNTER — Encounter: Admitting: Family Medicine
